# Patient Record
Sex: Male | Born: 1955 | Race: White | Hispanic: No | Marital: Married | Smoking: Never smoker
Health system: Southern US, Community
[De-identification: ages and names within clinical notes are randomized; demographics above are authoritative.]

## PROBLEM LIST (undated history)

## (undated) DIAGNOSIS — Z9989 Dependence on other enabling machines and devices: Principal | ICD-10-CM

## (undated) DIAGNOSIS — E782 Mixed hyperlipidemia: Secondary | ICD-10-CM

## (undated) DIAGNOSIS — K760 Fatty (change of) liver, not elsewhere classified: Secondary | ICD-10-CM

## (undated) DIAGNOSIS — E291 Testicular hypofunction: Secondary | ICD-10-CM

## (undated) DIAGNOSIS — E669 Obesity, unspecified: Secondary | ICD-10-CM

## (undated) DIAGNOSIS — M109 Gout, unspecified: Secondary | ICD-10-CM

## (undated) DIAGNOSIS — G4733 Obstructive sleep apnea (adult) (pediatric): Principal | ICD-10-CM

## (undated) HISTORY — DX: Testicular hypofunction: E29.1

## (undated) HISTORY — DX: Obesity, unspecified: E66.9

## (undated) HISTORY — DX: Obstructive sleep apnea (adult) (pediatric): G47.33

## (undated) HISTORY — DX: Gout, unspecified: M10.9

## (undated) HISTORY — DX: Mixed hyperlipidemia: E78.2

## (undated) HISTORY — DX: Dependence on other enabling machines and devices: Z99.89

## (undated) HISTORY — DX: Fatty (change of) liver, not elsewhere classified: K76.0

---
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
## 2001-05-10 ENCOUNTER — Encounter: Payer: Self-pay | Admitting: Internal Medicine

## 2001-05-10 ENCOUNTER — Ambulatory Visit (HOSPITAL_COMMUNITY): Admission: RE | Admit: 2001-05-10 | Discharge: 2001-05-10 | Payer: Self-pay | Admitting: Internal Medicine

## 2001-07-27 ENCOUNTER — Ambulatory Visit (HOSPITAL_COMMUNITY): Admission: RE | Admit: 2001-07-27 | Discharge: 2001-07-27 | Payer: Self-pay

## 2001-08-06 ENCOUNTER — Encounter: Admission: RE | Admit: 2001-08-06 | Discharge: 2001-09-25 | Payer: Self-pay | Admitting: *Deleted

## 2002-02-03 ENCOUNTER — Ambulatory Visit: Admission: RE | Admit: 2002-02-03 | Discharge: 2002-02-03 | Payer: Self-pay | Admitting: Internal Medicine

## 2005-08-19 ENCOUNTER — Ambulatory Visit (HOSPITAL_COMMUNITY): Admission: RE | Admit: 2005-08-19 | Discharge: 2005-08-19 | Payer: Self-pay | Admitting: Internal Medicine

## 2005-11-12 ENCOUNTER — Ambulatory Visit (HOSPITAL_COMMUNITY): Admission: RE | Admit: 2005-11-12 | Discharge: 2005-11-12 | Payer: Self-pay | Admitting: Orthopaedic Surgery

## 2007-08-17 ENCOUNTER — Ambulatory Visit (HOSPITAL_COMMUNITY): Admission: RE | Admit: 2007-08-17 | Discharge: 2007-08-17 | Payer: Self-pay | Admitting: Internal Medicine

## 2007-09-03 ENCOUNTER — Ambulatory Visit (HOSPITAL_COMMUNITY): Admission: RE | Admit: 2007-09-03 | Discharge: 2007-09-03 | Payer: Self-pay | Admitting: General Surgery

## 2007-09-15 ENCOUNTER — Ambulatory Visit (HOSPITAL_COMMUNITY): Admission: RE | Admit: 2007-09-15 | Discharge: 2007-09-15 | Payer: Self-pay | Admitting: Orthopaedic Surgery

## 2007-09-25 ENCOUNTER — Ambulatory Visit (HOSPITAL_COMMUNITY): Admission: RE | Admit: 2007-09-25 | Discharge: 2007-09-25 | Payer: Self-pay | Admitting: Orthopaedic Surgery

## 2009-06-04 IMAGING — CT CT ABDOMEN W/ CM
1 of 3 series · 14 of 32 positions shown, 19 images · IV contrast (Omnipaque 300)
Comparison: None

ABDOMEN CT WITH CONTRAST

CLINICAL DATA: Hematuria, back pain
TECHNIQUE: Multidetector CT imaging of the abdomen and pelvis was performed
following the standard protocol during bolus administration of intravenous
contrast.

Contrast:  100 cc Omnipaque 300

[Series 2: abd_pel 5.0 b40f · axial · 0.74mm/px · z∈[-469,-39]mm · 14 of 98 slices shown, 19 images]
[im 6/98  soft-tissue]
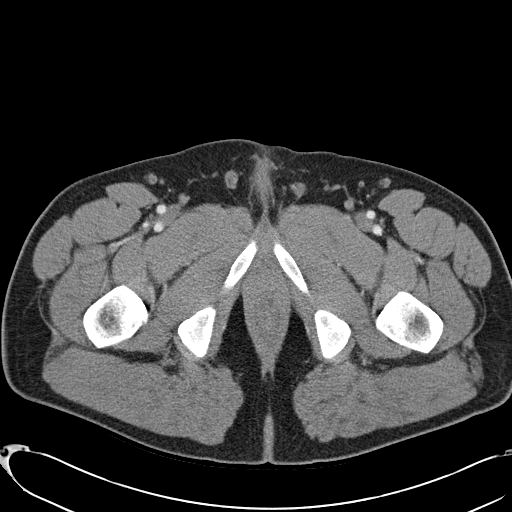
[im 6/98  bone]
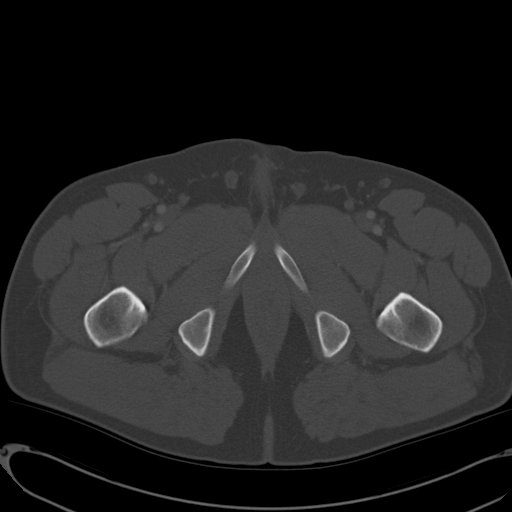
[im 11/98  soft-tissue]
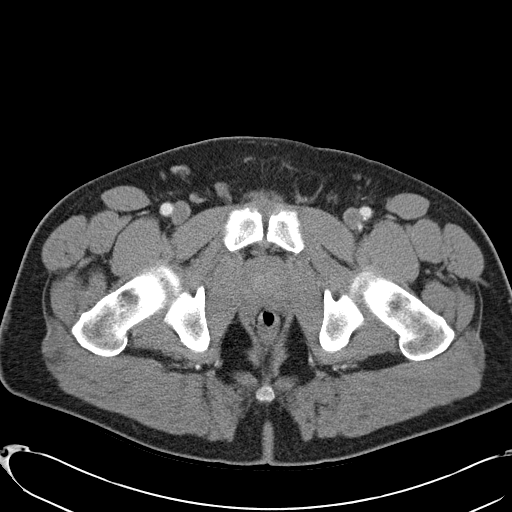
[im 22/98  soft-tissue]
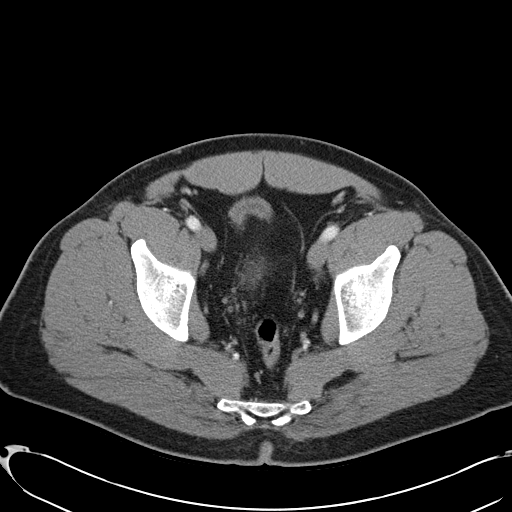
[im 27/98  soft-tissue]
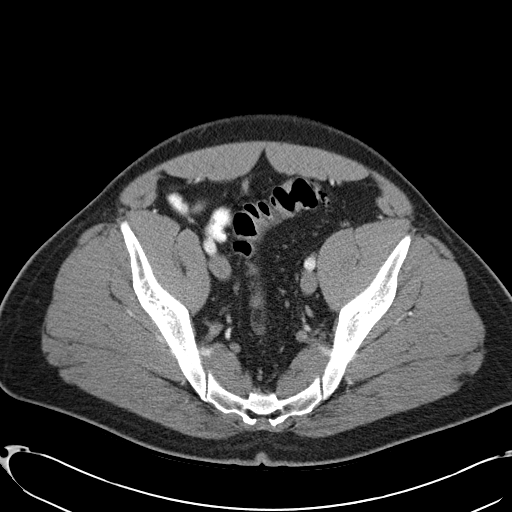
[im 33/98  soft-tissue]
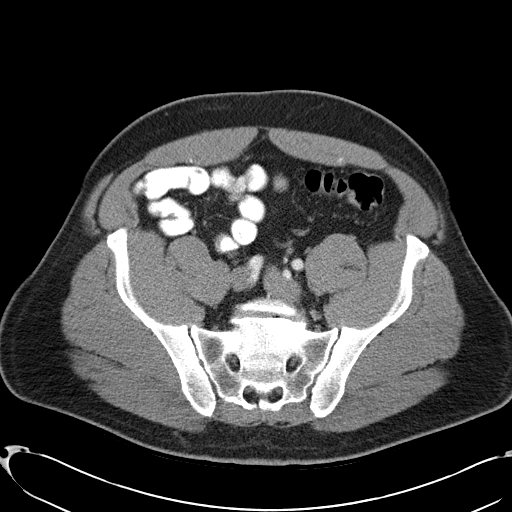
[im 44/98  soft-tissue]
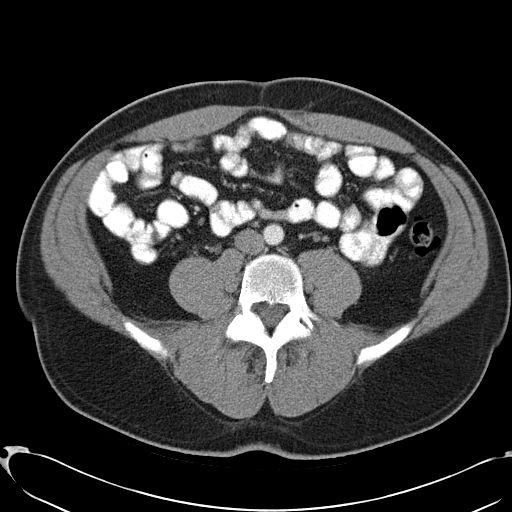
[im 49/98  soft-tissue]
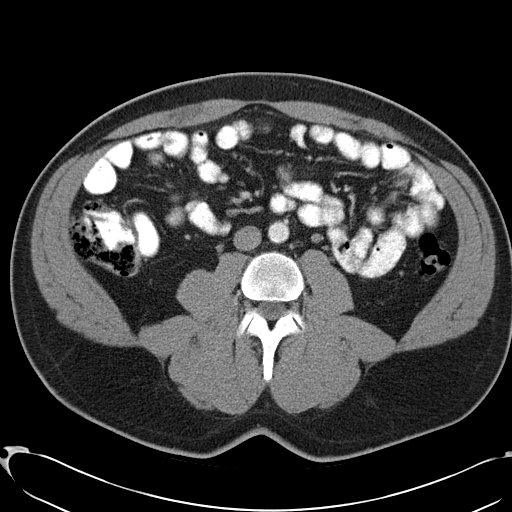
[im 54/98  soft-tissue]
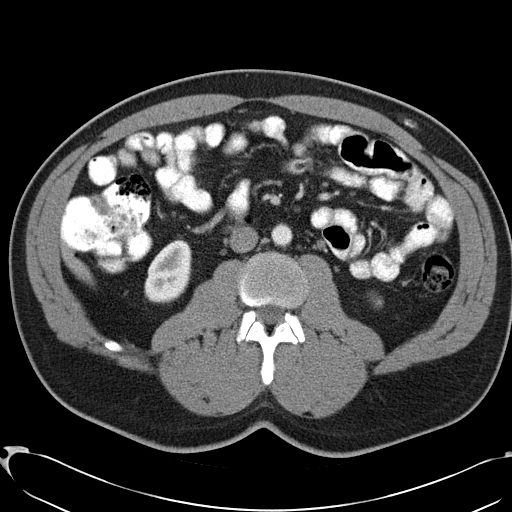
[im 65/98  soft-tissue]
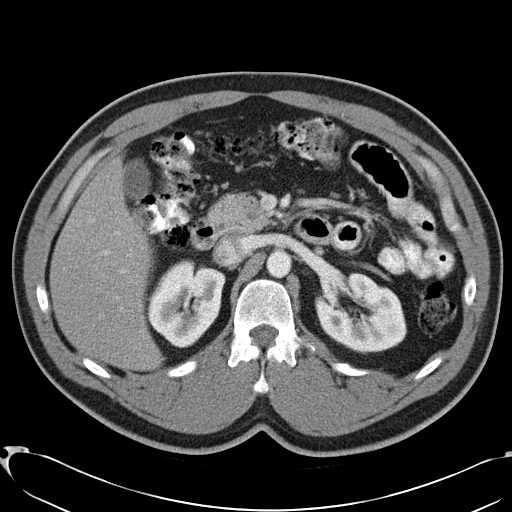
[im 65/98  bone]
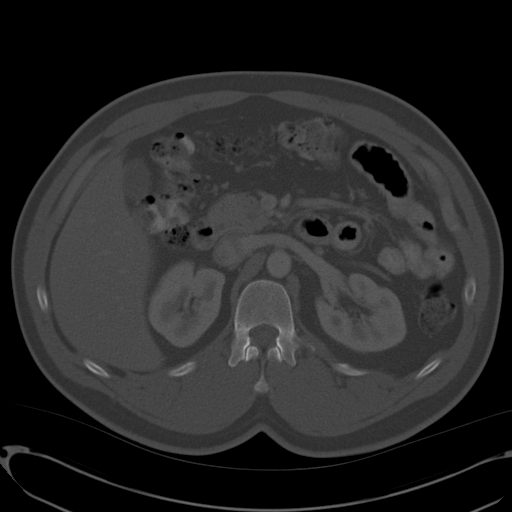
[im 71/98  soft-tissue]
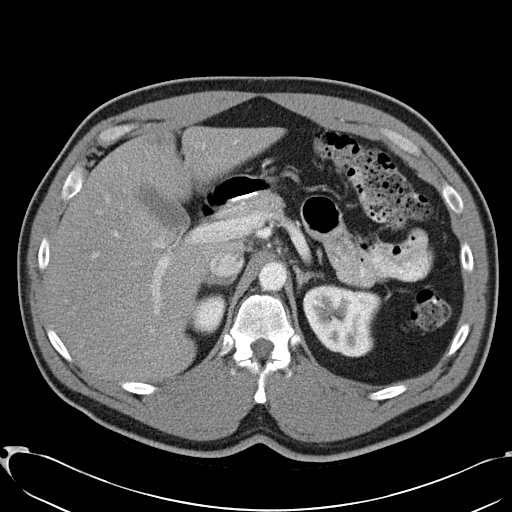
[im 76/98  soft-tissue]
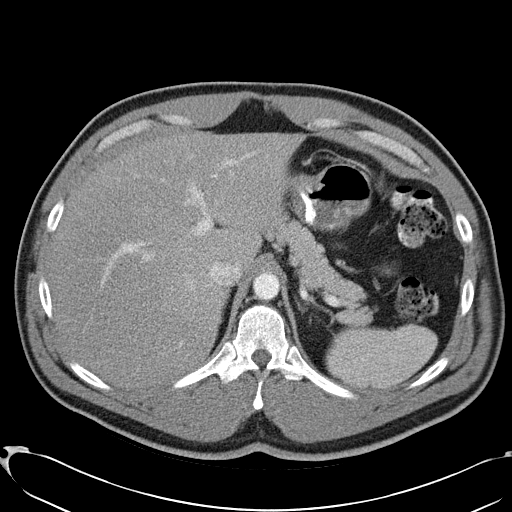
[im 76/98  lung]
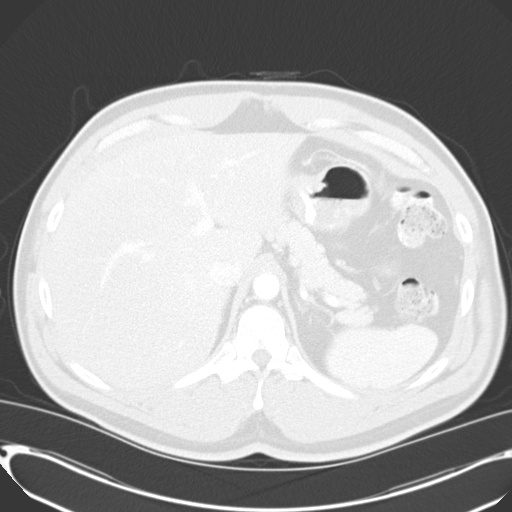
[im 81/98  lung]
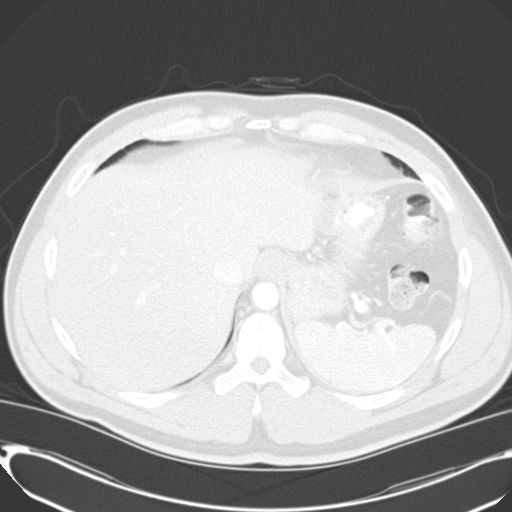
[im 87/98  soft-tissue]
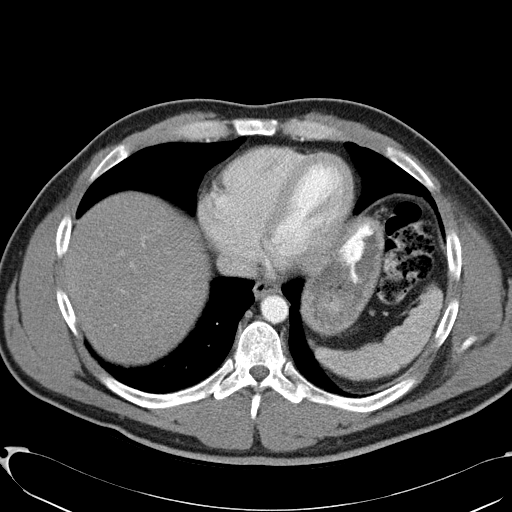
[im 87/98  lung]
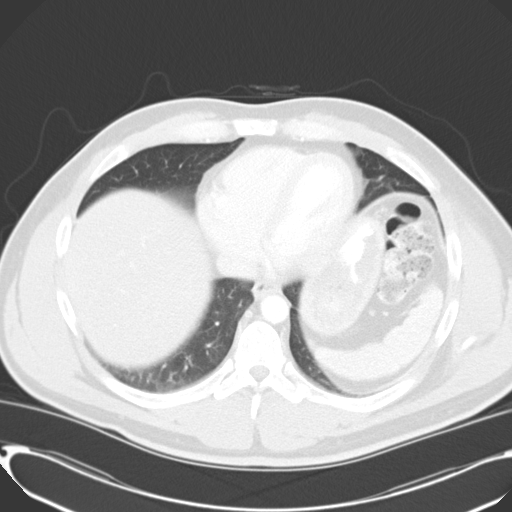
[im 92/98  soft-tissue]
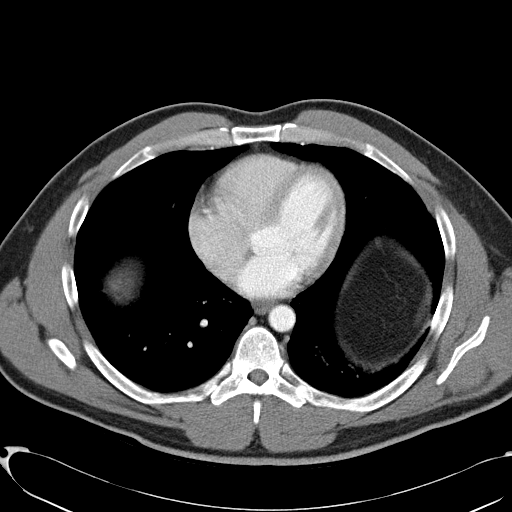
[im 92/98  lung]
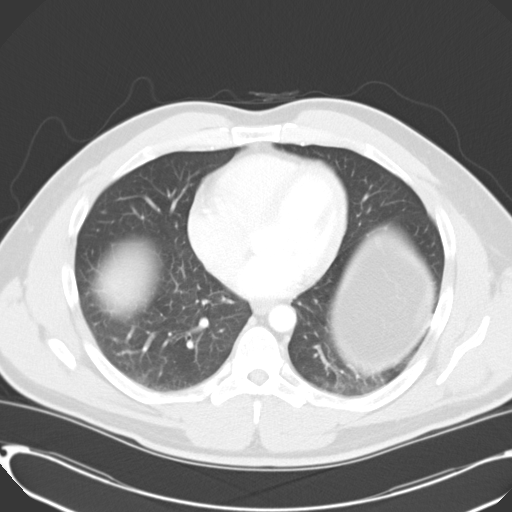

[14 of 32 positions shown; findings below may reference images not displayed]

FINDINGS: There is mild fatty infiltration of the liver. No focal lesion in the
liver, spleen, pancreas, adrenals, or kidneys. Gallbladder unremarkable. No free
fluid, free air, or adenopathy. Scattered mesenteric nodes noted, none of which
are pathologically enlarged.

No hydronephrosis within the kidneys. No stones. Bowel grossly unremarkable.
Lung bases are clear.

IMPRESSION

Mild fatty infiltration of the liver.

No acute findings in the abdomen.

PELVIS CT WITH CONTRAST
FINDINGS: Ureters are decompressed. No stones. Urinary bladder is unremarkable.
No free fluid, free air, or adenopathy. Pelvic large and small bowel
unremarkable except for scattered sigmoid diverticula.

Bony structures unremarkable.

IMPRESSION

No acute findings in the pelvis.

Mild sigmoid diverticulosis.

## 2009-06-12 ENCOUNTER — Ambulatory Visit (HOSPITAL_COMMUNITY): Admission: RE | Admit: 2009-06-12 | Discharge: 2009-06-12 | Payer: Self-pay | Admitting: Family Medicine

## 2010-10-14 ENCOUNTER — Encounter: Payer: Self-pay | Admitting: Orthopaedic Surgery

## 2010-10-14 ENCOUNTER — Encounter: Payer: Self-pay | Admitting: Urology

## 2014-06-13 ENCOUNTER — Institutional Professional Consult (permissible substitution): Payer: BC Managed Care – PPO | Admitting: Neurology

## 2014-06-13 ENCOUNTER — Encounter: Payer: Self-pay | Admitting: Neurology

## 2014-06-13 ENCOUNTER — Ambulatory Visit (INDEPENDENT_AMBULATORY_CARE_PROVIDER_SITE_OTHER): Payer: BC Managed Care – PPO | Admitting: Neurology

## 2014-06-13 VITALS — BP 117/79 | HR 75 | Resp 18 | Ht 68.0 in | Wt 211.0 lb

## 2014-06-13 DIAGNOSIS — Z9989 Dependence on other enabling machines and devices: Principal | ICD-10-CM

## 2014-06-13 DIAGNOSIS — G4733 Obstructive sleep apnea (adult) (pediatric): Secondary | ICD-10-CM

## 2014-06-13 HISTORY — DX: Obstructive sleep apnea (adult) (pediatric): G47.33

## 2014-06-13 NOTE — Patient Instructions (Signed)
Polysomnography (Sleep Studies) Polysomnography (PSG) is a series of tests used for detecting (diagnosing) obstructive sleep apnea and other sleep disorders. The tests measure how some parts of your body are working while you are sleeping. The tests are extensive and expensive. They are done in a sleep lab or hospital, and vary from center to center. Your caregiver may perform other more simple sleep studies and questionnaires before doing more complete and involved testing. Testing may not be covered by insurance. Some of these tests are:  An EEG (Electroencephalogram). This tests your brain waves and stages of sleep.  An EOG (Electrooculogram). This measures the movements of your eyes. It detects periods of REM (rapid eye movement) sleep, which is your dream sleep.  An EKG (Electrocardiogram). This measures your heart rhythm.  EMG (Electromyography). This is a measurement of how the muscles are working in your upper airway and your legs while sleeping.  An oximetry measurement. It measures how much oxygen (air) you are getting while sleeping.  Breathing efforts may be measured. The same test can be interpreted (understood) differently by different caregivers and centers that study sleep.  Studies may be given an apnea/hypopnea index (AHI). This is a number which is found by counting the times of no breathing or under breathing during the night, and relating those numbers to the amount of time spent in bed. When the AHI is greater than 15, the patient is likely to complain of daytime sleepiness. When the AHI is greater than 30, the patient is at increased risk for heart problems and must be followed more closely. Following the AHI also allows you to know how treatment is working. Simple oximetry (tracking the amount of oxygen that is taken in) can be used for screening patients who:  Do not have symptoms (problems) of OSA.  Have a normal Epworth Sleepiness Scale Score.  Have a low pre-test  probability of having OSA.  Have none of the upper airway problems likely to cause apnea.  Oximetry is also used to determine if treatment is effective in patients who showed significant desaturations (not getting enough oxygen) on their home sleep study. One extra measure of safety is to perform additional studies for the person who only snores. This is because no one can predict with absolute certainty who will have OSA. Those who show significant desaturations (not getting enough oxygen) are recommended to have a more detailed sleep study. Document Released: 03/16/2003 Document Revised: 12/02/2011 Document Reviewed: 11/15/2013 ExitCare Patient Information 2015 [employer]. This information is not intended to replace advice given to you by your health care provider. Make sure you discuss any questions you have with your health care provider.  

## 2014-06-13 NOTE — Progress Notes (Signed)
SLEEP MEDICINE CLINIC   Provider:  Melvyn Novas, M D  Referring Provider: Elfredia Nevins, MD Primary Care Physician:  Cassell Smiles., MD  Chief Complaint  Patient presents with  . New Evaluation    Room 10  . Sleep consult    HPI:  Eric Johns is a 58 y.o. male, who is seen here as a revisit from Dr. Sherwood Gambler for a sleep consult.   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 He lived in [place], in [place], This frequent traveling and living at primitive circumstances has made the use of his current CPAP  difficult. He was diagnosed several years ago with obstructive sleep apnea and a CPAP machine was prescribed at 8 cm water pressure. The study was performed at [facility] in [place], [place].  His current machine he has used for 6 years and this is the second machine he has over all and this diagnosis it is likely more than a decade ago. Because of the restrictions with carry-on luggage and living circumstances and some second [place] countries he has not limited to CPAP and was in contact with a dentist. He received a dental device is some ulnad these retainer. This may have triggered his snoring, but he by varying the device he would be sweat drenched- he noted that when waking up. It is very likely that he had tachycardia and bradycardia arrhythmias due to  oxygen deficiency when using a "snoring" device only.  He [place], and would like to be evaluated first.  Was able today to access the download data package from the patient CPAP machine as a lead to respiratory she. The data collection begun 03-15-14 at and at 06-12-14 the patient's machine is set at 8 cm water the median air leak is 14.4 L per minute with residual AHI was 8.2. I was a little bit high.  His average times daily spent with CPAP user 6 hours and 6 minutes and - fully complies with the requirements of his  insurance carrier. 86% compliance over 4 hours. No a FFM user.  He never heard of nasal pillows.   He estimated his nocturnal sleep time 6-7 hours, bedtime 11 , rise time 6.30 on average , one bathroom break. Sleeps alone. Dry mouth . Eye drop and nasal saline spray and a drink of water in AM>       Review of Systems: Out of a complete 14 system review, the patient complains of only the following symptoms, and all other reviewed systems are negative.   Epworth score  7 , Fatigue severity score 22  , depression score 6   History   Social History  . Marital Status: Married    Spouse Name: [name]    Number of Children: 3  . Years of Education: College   Occupational History  . Not on file.   Social History Main Topics  . Smoking status: Never Smoker   . Smokeless tobacco: Former Neurosurgeon    Types: Snuff    Quit date: 08/24/2007  . Alcohol Use: 0.6 - 1.2 oz/week    1-2 Glasses of wine per week  . Drug Use: No  . Sexual Activity: Not on file   Other Topics Concern  . Not on file   Social History Narrative   Patient is married [name]) and lives at home with his wife and step-son.   Patient is retired and working part-time -  Consultant work.   Patient is currently in college for his Master's degree.   Patient is right-handed.   Patient has three children and his wife has two children.   Patient drinks one cup of coffee daily.    Family History  Problem Relation Age of Onset  . Diabetes Maternal Grandfather   . Emphysema Maternal Grandfather   . Diverticulitis Father     Past Medical History  Diagnosis Date  . Sleep apnea   . Obesity   . Snores     Past Surgical History  Procedure Laterality Date  . Appendectomy  1972    Current Outpatient Prescriptions  Medication Sig Dispense Refill  . colchicine 0.6 MG tablet Take 0.6 mg by mouth. Four times a day as needed      . Multiple Vitamins-Minerals (MULTIVITAMIN PO) Take by mouth. Taking daily ( [employer] pack)      .  naproxen (NAPROSYN) 500 MG tablet Take 500 mg by mouth 2 (two) times daily with a meal.      . sildenafil (VIAGRA) 100 MG tablet Take 100 mg by mouth daily as needed for erectile dysfunction.       No current facility-administered medications for this visit.    Allergies as of 06/13/2014  . (Not on File)    Vitals: BP 117/79  Pulse 75  Resp 18  Ht  (1.727 m)  Wt 211 lb (95.709 kg)  BMI 32.09 kg/m2 Last Weight:  Wt Readings from Last 1 Encounters:  06/13/14 211 lb (95.709 kg)       Last Height:   Ht Readings from Last 1 Encounters:  06/13/14  (1.727 m)    Physical exam:  General: The patient is awake, alert and appears not in acute distress. The patient is well groomed. Head: Normocephalic, atraumatic. Neck is supple. Mallampati 3 -4,  neck circumference: 18.5 . Nasal airflow not restricted , TMJ is not  evident . Retrognathia is not seen.  Cardiovascular:  Regular rate and rhythm, without  murmurs or carotid bruit, and without distended neck veins. Respiratory: Lungs are clear to auscultation. Skin:  Without evidence of edema, or rash Trunk: BMI is elevated and patient  has normal posture.  Neurologic exam : The patient is awake and alert, oriented to place and time.   Memory subjective described as intact. There is a normal attention span & concentration ability.  Speech is fluent without dysarthria, dysphonia or aphasia. Mood and affect are appropriate.  Cranial nerves: Pupils are equal and briskly reactive to light. Funduscopic exam without evidence of pallor or edema.  Extraocular movements  in vertical and horizontal planes intact and without nystagmus. Visual fields by finger perimetry are intact. Hearing to finger rub intact.  Facial sensation intact to fine touch. Facial motor strength is symmetric and tongue and uvula move midline.  Motor exam:  Normal tone, muscle bulk and symmetric ,strength in all extremities.  Sensory:  Fine touch, pinprick and  vibration were tested in all extremities. Proprioception is normal.  Coordination: Rapid alternating movements in the fingers/hands is normal.  Finger-to-nose maneuver normal without evidence of ataxia, dysmetria or tremor.  Gait and station: Patient walks without assistive device and is able unassisted to climb up to the exam table.  Strength within normal limits. Stance is stable and normal. Tandem gait is unfragmented. Romberg testing is negative.  Deep tendon reflexes: in the upper and lower extremities are symmetric and intact. Babinski maneuver response is  downgoing.   Assessment:  After physical and neurologic examination, review of laboratory studies, imaging, neurophysiology testing and pre-existing records, assessment is  Patient on CPAP 8 cm, baseline study not available, likely 12 years ago , [facility].    Residual AHI - too high. Retitration necessary. Patient has facial hair, would likely do better on nasal pillow.  Co2 and SPLIT at 15 AHI, score at 3%.  The patient was advised of the nature of the diagnosed sleep disorder , the treatment options and risks for general a health and wellness arising from not treating the condition. Visit duration was 45 minutes.   Plan:  Treatment plan and additional workup :  Split after earliest point. AHI 15, score at 3 % ,  Nasal pillow.  patient should bring his dental device to the lab.          Porfirio Mylar Caldonia Leap MD  06/13/2014

## 2014-07-20 ENCOUNTER — Ambulatory Visit (INDEPENDENT_AMBULATORY_CARE_PROVIDER_SITE_OTHER): Payer: BC Managed Care – PPO | Admitting: Neurology

## 2014-07-20 ENCOUNTER — Encounter: Payer: Self-pay | Admitting: Neurology

## 2014-07-20 VITALS — BP 138/68

## 2014-07-20 DIAGNOSIS — Z9989 Dependence on other enabling machines and devices: Principal | ICD-10-CM

## 2014-07-20 DIAGNOSIS — G4733 Obstructive sleep apnea (adult) (pediatric): Secondary | ICD-10-CM

## 2014-07-20 NOTE — Sleep Study (Signed)
Please see the scanned sleep study interpretation located in the Procedure tab within the Chart Review section. 

## 2014-08-01 ENCOUNTER — Other Ambulatory Visit: Payer: Self-pay | Admitting: Neurology

## 2014-08-01 ENCOUNTER — Encounter: Payer: Self-pay | Admitting: *Deleted

## 2014-08-01 ENCOUNTER — Other Ambulatory Visit (HOSPITAL_COMMUNITY): Payer: Self-pay | Admitting: Nephrology

## 2014-08-01 ENCOUNTER — Telehealth: Payer: Self-pay | Admitting: *Deleted

## 2014-08-01 DIAGNOSIS — N183 Chronic kidney disease, stage 3 unspecified: Secondary | ICD-10-CM

## 2014-08-01 DIAGNOSIS — G4733 Obstructive sleep apnea (adult) (pediatric): Secondary | ICD-10-CM

## 2014-08-01 DIAGNOSIS — R0902 Hypoxemia: Secondary | ICD-10-CM

## 2014-08-01 NOTE — Telephone Encounter (Signed)
Patient was contacted and provided the results of his split night sleep study.  Patient was referred to [facility] for CPAP set up.  Patient was mailed a copy of his test results and Dr. Elfredia NevinsLawrence Fusco was faxed a copy.   Patient instructed to contact our office 6-8 weeks post set up to schedule a follow up appointment.

## 2014-08-10 ENCOUNTER — Ambulatory Visit (HOSPITAL_COMMUNITY): Admission: RE | Admit: 2014-08-10 | Payer: BC Managed Care – PPO | Source: Ambulatory Visit

## 2014-10-08 ENCOUNTER — Encounter: Payer: Self-pay | Admitting: Neurology

## 2015-05-09 DIAGNOSIS — R898 Other abnormal findings in specimens from other organs, systems and tissues: Secondary | ICD-10-CM | POA: Insufficient documentation

## 2015-05-09 DIAGNOSIS — D721 Eosinophilia: Secondary | ICD-10-CM

## 2015-08-08 ENCOUNTER — Other Ambulatory Visit: Payer: Self-pay | Admitting: Oncology

## 2015-08-08 DIAGNOSIS — R7989 Other specified abnormal findings of blood chemistry: Secondary | ICD-10-CM | POA: Insufficient documentation

## 2015-08-08 DIAGNOSIS — R945 Abnormal results of liver function studies: Secondary | ICD-10-CM | POA: Insufficient documentation

## 2015-08-08 DIAGNOSIS — D721 Eosinophilia, unspecified: Secondary | ICD-10-CM

## 2015-08-11 ENCOUNTER — Ambulatory Visit
Admission: RE | Admit: 2015-08-11 | Discharge: 2015-08-11 | Disposition: A | Discharge status: Home / Self Care | Payer: BC Managed Care – PPO | Source: Ambulatory Visit | Attending: Oncology | Admitting: Oncology

## 2015-08-11 DIAGNOSIS — D721 Eosinophilia, unspecified: Secondary | ICD-10-CM

## 2015-08-11 DIAGNOSIS — R7989 Other specified abnormal findings of blood chemistry: Secondary | ICD-10-CM | POA: Diagnosis not present

## 2015-08-11 DIAGNOSIS — R945 Abnormal results of liver function studies: Secondary | ICD-10-CM

## 2015-08-11 MED ORDER — IOHEXOL 300 MG/ML  SOLN
100.0000 mL | Freq: Once | INTRAMUSCULAR | Status: AC | PRN
  Administered 2015-08-11: 100 mL via INTRAVENOUS

## 2016-03-04 ENCOUNTER — Encounter: Payer: Self-pay | Admitting: Neurology

## 2016-03-04 ENCOUNTER — Ambulatory Visit (INDEPENDENT_AMBULATORY_CARE_PROVIDER_SITE_OTHER): Payer: BC Managed Care – PPO | Admitting: Neurology

## 2016-03-04 VITALS — BP 102/72 | HR 68 | Resp 20 | Ht 68.0 in | Wt 216.0 lb

## 2016-03-04 DIAGNOSIS — Z9989 Dependence on other enabling machines and devices: Principal | ICD-10-CM

## 2016-03-04 DIAGNOSIS — G4733 Obstructive sleep apnea (adult) (pediatric): Secondary | ICD-10-CM

## 2016-03-04 NOTE — Progress Notes (Signed)
SLEEP MEDICINE CLINIC   Provider:  Melvyn Novasarmen  Ezinne Yogi, M D  Referring Provider: Elfredia NevinsFusco, Lawrence, MD Primary Care Physician:  Cassell SmilesFUSCO,LAWRENCE J., MD  Chief Complaint  Patient presents with  . New Patient (Initial Visit)    cpap going well, using [facility], rm 11, alone    HPI:  Eric Johns is a 60 y.o. male, who is seen here as a revisit from Dr. Sherwood GamblerFusco for a sleep consult.   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 He lived in [place], in [place], This frequent traveling and living at primitive circumstances has made the use of his current CPAP  difficult. He was diagnosed several years ago with obstructive sleep apnea and a CPAP machine was prescribed at 8 cm water pressure. The study was performed at [facility] in [place], [place].  His current machine he has used for 6 years and this is the second machine he has over all and this diagnosis it is likely more than a decade ago. Because of the restrictions with carry-on luggage and living circumstances and some second [place] countries he has not limited to CPAP and was in contact with a dentist. He received a dental device is some ulnad these retainer. This may have triggered his snoring, but he by varying the device he would be sweat drenched- he noted that when waking up. It is very likely that he had tachycardia and bradycardia arrhythmias due to  oxygen deficiency when using a "snoring" device only.  He is [place], and would like to be evaluated first.  Was able today to access the download data package from the patient CPAP machine . The data collection begun 03-15-14 at and ended on  06-12-14 the patient's machine is set at 8 cm water the median air leak is 14.4 L per minute with residual AHI was 8.2. I was a little bit high.  His average times daily spent with CPAP user 6 hours and 6 minutes and - fully complies with the requirements of  his insurance carrier. 86% compliance over 4 hours. No a FFM user.  He never heard of nasal pillows.   He estimated his nocturnal sleep time 6-7 hours, bedtime 11 , rise time 6.30 on average , one bathroom break. Sleeps alone. Dry mouth . Eye drop and nasal saline spray and a drink of water in AM>   03-13-2016,  I have the pleasure of seeing Mr. 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 Mr. Eric Johns's CPAP data are impressive he's 100% compliant for the last 30 days the average user time is 6 hours and 57 minutes. He is using an AutoSet between 4 and 8 cm water pressure with 3 cm EPR. AHI is 6.4 which is a little higher than I like. He does not have significant air leaks. The 95th percentile pressure used is now 7.9 cm. This leads to see assumption that we need to raise his pressure window. [facility] will [facility] his maximum pressure to 10 cm water for that to his room.  His last pressure was determined after a split-night polysomnography dated 28th of October 2015 that the patient's diagnostic AHI was 25.8 in supine sleep 31.4 and there was no REM sleep seen in the diagnostic portion oxygen  nadir during the study was 84% saturation with a total desaturation time of 55.6 minutes. Titration study reduce the oxygen desaturations significantly and the overall AHI was 3.2 at 8 cm water.    Review of Systems: Out of a complete 14 system review, the patient complains of only the following symptoms, and all other reviewed systems are negative.  Epworth score 4 from   7 , Fatigue severity score still 22  , depression score 6.   Social History   Social History  . Marital Status: Married    Spouse Name: Eric Johns  . Number of Children: 3  . Years of Education: College   Occupational History  . Not on file.   Social  History Main Topics  . Smoking status: Never Smoker   . Smokeless tobacco: Former Neurosurgeon    Types: Snuff    Quit date: 08/24/2007  . Alcohol Use: 0.6 - 1.2 oz/week    1-2 Glasses of wine per week  . Drug Use: No  . Sexual Activity: Not on file   Other Topics Concern  . Not on file   Social History Narrative   Patient is married Eric Johns) and lives at home with his wife and step-son.   Patient is retired and working part-time - Research scientist (medical) work.   Patient is currently in college for his Master's degree.   Patient is right-handed.   Patient has three children and his wife has two children.   Patient drinks one cup of coffee daily.    Family History  Problem Relation Age of Onset  . Diabetes Maternal Grandfather   . Emphysema Maternal Grandfather   . Diverticulitis Father     Past Medical History  Diagnosis Date  . Sleep apnea   . Obesity   . Snores   . OSA on CPAP 06/13/2014  . Mixed hyperlipidemia     Past Surgical History  Procedure Laterality Date  . Appendectomy  1972    Current Outpatient Prescriptions  Medication Sig Dispense Refill  . allopurinol (ZYLOPRIM) 100 MG tablet Take by mouth.    Marland Kitchen aspirin 81 MG tablet Take 81 mg by mouth daily.    . fluticasone (FLONASE) 50 MCG/ACT nasal spray Place into both nostrils daily.    . Multiple Vitamins-Minerals (MULTIVITAMIN PO) Take by mouth. Taking daily ( [employer] pack)     No current facility-administered medications for this visit.    Allergies as of 03/04/2016  . (No Known Allergies)    Vitals: BP 102/72 mmHg  Pulse 68  Resp 20  Ht  (1.727 m)  Wt 216 lb (97.977 kg)  BMI 32.85 kg/m2 Last Weight:  Wt Readings from Last 1 Encounters:  03/04/16 216 lb (97.977 kg)       Last Height:   Ht Readings from Last 1 Encounters:  03/04/16  (1.727 m)    Physical exam:  General: The patient is awake, alert and appears not in acute distress. The patient is well groomed. Head: Normocephalic, atraumatic. Neck is  supple. Mallampati 3 -4,  neck circumference: 18.5 . Nasal airflow not restricted , TMJ is not  evident . Retrognathia is not seen.  Cardiovascular:  Regular rate and rhythm, without  murmurs or carotid bruit, and without distended neck veins. Respiratory: Lungs are clear to auscultation. Skin:  Without evidence of edema, or rash Trunk: BMI is elevated and patient  has normal posture.  Neurologic exam : The patient is awake and alert, oriented to place and time.  Memory subjective described as intact. There is a normal attention span & concentration ability.  Speech is fluent without dysarthria, dysphonia or aphasia. Mood and affect are appropriate.  Cranial nerves: Pupils are equal and briskly reactive to light. Funduscopic exam without evidence of pallor or edema.  Extraocular movements  in vertical and horizontal planes intact and without nystagmus. Visual fields by finger perimetry are intact. Hearing to finger rub intact.  Facial sensation intact to fine touch. Facial motor strength is symmetric and tongue and uvula move midline.    Assessment:  After physical and neurologic examination, review of laboratory studies, imaging, neurophysiology testing and pre-existing records, assessment is  Patient was on CPAP 8 cm, baseline study wasnot available, likely 14 years ago , [employer] [facility].  3 titrated to an October 2015 and has used an auto titrated. Since his AHI is slightly higher than I like and not associated with a high air leak I will open the upper pressure window for his auto titration from between 4 and 8 to 4 and 10 cm .   The patient was advised of the nature of the diagnosed sleep disorder, the treatment options and risks for general a health and wellness arising from not treating the condition. Visit duration was 25 minutes.   Plan:  Treatment plan and additional workup : [facility] is the [facility]'s [facility] for supplies. He will need a new mask headgear filter tubing etc.  I would like for it once [facility] to reset the auto titration from 4-10 cm water pressure. RV in 12 month .            Porfirio Mylar Kaliana Albino MD  03/04/2016

## 2016-08-28 ENCOUNTER — Ambulatory Visit (INDEPENDENT_AMBULATORY_CARE_PROVIDER_SITE_OTHER): Payer: BC Managed Care – PPO

## 2016-08-28 ENCOUNTER — Ambulatory Visit (INDEPENDENT_AMBULATORY_CARE_PROVIDER_SITE_OTHER): Payer: BC Managed Care – PPO | Admitting: Physician Assistant

## 2016-08-28 VITALS — BP 122/72 | HR 86 | Temp 98.4°F | Resp 17 | Ht 67.5 in | Wt 224.0 lb

## 2016-08-28 DIAGNOSIS — R05 Cough: Secondary | ICD-10-CM | POA: Diagnosis not present

## 2016-08-28 DIAGNOSIS — R059 Cough, unspecified: Secondary | ICD-10-CM

## 2016-08-28 MED ORDER — BENZONATATE 100 MG PO CAPS: 100.0000 mg | ORAL_CAPSULE | Freq: Three times a day (TID) | ORAL | 0 refills | Status: DC | PRN

## 2016-08-28 MED ORDER — AZITHROMYCIN 250 MG PO TABS: ORAL_TABLET | ORAL | 0 refills | Status: DC

## 2016-08-28 MED ORDER — ALBUTEROL SULFATE 108 (90 BASE) MCG/ACT IN AEPB: 2.0000 | INHALATION_SPRAY | Freq: Four times a day (QID) | RESPIRATORY_TRACT | 0 refills | Status: DC | PRN

## 2016-08-28 MED ORDER — HYDROCOD POLST-CPM POLST ER 10-8 MG/5ML PO SUER: 5.0000 mL | Freq: Two times a day (BID) | ORAL | 0 refills | Status: DC | PRN

## 2016-08-28 NOTE — Progress Notes (Signed)
MRN: [id number] DOB: 1955-11-02  Subjective:   Eric Johns is a 60 y.o. male presenting for chief complaint of URI .  Reports 5 day history of sinus headache, sinus pain, ear fullness, sore throat, dry cough (no hemoptysis), wheezing, shortness of breath and chest tightness, chills. Has tried robitussin dm, mucinex dm, generic theraflu, and cough drops with no relief and he actually feels worse. Denies fever,  night sweats, nausea, vomiting and abdominal pain. Has not had sick contact with anyone. No history of seasonal allergies or asthma. No smoking. 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  Denies any other aggravating or relieving factors, no other questions or concerns.  Eric Johns has a current medication list which includes the following prescription(s): allopurinol, aspirin, colchicine, fluticasone, and multiple vitamins-minerals. Also has No Known Allergies.  Eric Johns  has a past medical history of Mixed hyperlipidemia; Obesity; OSA on CPAP (06/13/2014); Sleep apnea; and Snores. Also  has a past surgical history that includes Appendectomy (1972).   Objective:   Vitals: BP 122/72 (BP Location: Right Arm, Patient Position: Sitting, Cuff Size: Normal)   Pulse 86   Temp 98.4 F (36.9 C) (Oral)   Resp 17   Ht 5' 7.5" (1.715 m)   Wt 224 lb (101.6 kg)   SpO2 95%   BMI 34.57 kg/m   Physical Exam  Constitutional: He is oriented to person, place, and time. He appears well-developed and well-nourished.  HENT:  Head: Normocephalic and atraumatic.  Right Ear: Tympanic membrane, external ear and ear canal normal.  Left Ear: Tympanic membrane, external ear and ear canal normal.  Nose: Mucosal edema and rhinorrhea present. Right sinus exhibits no maxillary sinus tenderness and no frontal sinus tenderness. Left sinus exhibits no maxillary sinus tenderness and no frontal sinus tenderness.  Mouth/Throat: Uvula is midline and mucous membranes are normal. Posterior oropharyngeal erythema present.    Eyes: Conjunctivae are normal.  Neck: Normal range of motion.  Cardiovascular: Normal rate, regular rhythm and normal heart sounds.   Pulmonary/Chest: Effort normal. He has rales (small area noted on posterior left lung field ).  Lymphadenopathy:       Head (right side): No submental, no submandibular, no tonsillar, no preauricular, no posterior auricular and no occipital adenopathy present.       Head (left side): No submental, no submandibular, no tonsillar, no preauricular, no posterior auricular and no occipital adenopathy present.    He has no cervical adenopathy.       Right: No supraclavicular adenopathy present.       Left: No supraclavicular adenopathy present.  Neurological: He is alert and oriented to person, place, and time.  Skin: Skin is warm and dry.  Psychiatric: He has a normal mood and affect.  Vitals reviewed.   No results found for this or any previous visit (from the past 24 hour(s)).  Dg Chest 2 View  Result Date: 08/28/2016 CLINICAL DATA:  Five days of cough and shortness of breath. Abnormal chest exam posteriorly on the left. History of obesity, sleep apnea, patient is on CPAP. Nonsmoker. EXAM: CHEST  2 VIEW COMPARISON:  None. FINDINGS: The lungs are adequately inflated. There is mild elevation of the left hemidiaphragm with gas-filled splenic flexure noted beneath it. There is no interstitial or alveolar infiltrate. There is no pleural effusion or pneumothorax. The heart and pulmonary vascularity are normal. There is calcification in the wall of the aortic arch. The mediastinum is normal in width. The bony structures exhibit no acute abnormalities. IMPRESSION:  There is no active cardiopulmonary disease. Thoracic aortic atherosclerosis. Electronically Signed   By: David  Jordan M.D.   On: 08/28/2016 10:44    Assessment and [place] :  1. Cough - Due to physical exam findings worsening of patient's symptoms, will cover for atypical organisms.  - DG Chest 2 View;  Future - azithromycin (ZITHROMAX) 250 MG tablet; Take 2 tabs PO x 1 dose, then 1 tab PO QD x 4 days  Dispense: 6 tablet; Refill: 0 - benzonatate (TESSALON) 100 MG capsule; Take 1-2 capsules (100-200 mg total) by mouth 3 (three) times daily as needed for cough.  Dispense: 40 capsule; Refill: 0 - chlorpheniramine-HYDROcodone (TUSSIONEX PENNKINETIC ER) 10-8 MG/5ML SUER; Take 5 mLs by mouth every 12 (twelve) hours as needed for cough.  Dispense: 100 mL; Refill: 0 - Albuterol Sulfate (PROAIR RESPICLICK) 108 (90 Base) MCG/ACT AEPB; Inhale 2 puffs into the lungs every 6 (six) hours as needed.  Dispense: 1 each; Refill: 0 -Return to clinic if symptoms worsen, do not improve in one week, or as needed   Eric CoreBrittany Amee Boothe, PA-C  [facility] 08/28/2016 10:56 AM

## 2016-08-28 NOTE — Patient Instructions (Addendum)
Follow up if no improvement in one week, return sooner if symptoms worsen.   Acute Bronchitis, Adult Acute bronchitis is when air tubes (bronchi) in the lungs suddenly get swollen. The condition can make it hard to breathe. It can also cause these symptoms:  A cough.  Coughing up clear, yellow, or green mucus.  Wheezing.  Chest congestion.  Shortness of breath.  A fever.  Body aches.  Chills.  A sore throat. Follow these instructions at home: Medicines  Take over-the-counter and prescription medicines only as told by your doctor.  If you were prescribed an antibiotic medicine, take it as told by your doctor. Do not stop taking the antibiotic even if you start to feel better. General instructions  Rest.  Drink enough fluids to keep your pee (urine) clear or pale yellow.  Avoid smoking and secondhand smoke. If you smoke and you need help quitting, ask your doctor. Quitting will help your lungs heal faster.  Use an inhaler, cool mist vaporizer, or humidifier as told by your doctor.  Keep all follow-up visits as told by your doctor. This is important. How is this prevented? To lower your risk of getting this condition again:  Wash your hands often with soap and water. If you cannot use soap and water, use hand sanitizer.  Avoid contact with people who have cold symptoms.  Try not to touch your hands to your mouth, nose, or eyes.  Make sure to get the flu shot every year. Contact a doctor if:  Your symptoms do not get better in 2 weeks. Get help right away if:  You cough up blood.  You have chest pain.  You have very bad shortness of breath.  You become dehydrated.  You faint (pass out) or keep feeling like you are going to pass out.  You keep throwing up (vomiting).  You have a very bad headache.  Your fever or chills gets worse. This information is not intended to replace advice given to you by your health care provider. Make sure you discuss any  questions you have with your health care provider. Document Released: 02/26/2008 Document Revised: 04/17/2016 Document Reviewed: 02/28/2016 Elsevier Interactive Patient Education  2017 [employer]    IF you received an x-ray today, you will receive an invoice from [facility]. Please contact [facility] at [phone] with questions or concerns regarding your invoice.   IF you received labwork today, you will receive an invoice from [facility]. Please contact [facility] at [phone] with questions or concerns regarding your invoice.   Our billing staff will not be able to assist you with questions regarding bills from these companies.  You will be contacted with the lab results as soon as they are available. The fastest way to get your results is to activate your My Chart account. Instructions are located on the last page of this paperwork. If you have not heard from us regarding the results in 2 weeks, please contact this office.

## 2016-11-09 ENCOUNTER — Ambulatory Visit (INDEPENDENT_AMBULATORY_CARE_PROVIDER_SITE_OTHER): Payer: BC Managed Care – PPO | Admitting: Family Medicine

## 2016-11-09 VITALS — BP 134/76 | HR 70 | Temp 97.5°F | Resp 16 | Ht 67.5 in | Wt 221.2 lb

## 2016-11-09 DIAGNOSIS — M1A0710 Idiopathic chronic gout, right ankle and foot, without tophus (tophi): Secondary | ICD-10-CM

## 2016-11-09 DIAGNOSIS — Z13220 Encounter for screening for lipoid disorders: Secondary | ICD-10-CM

## 2016-11-09 DIAGNOSIS — K760 Fatty (change of) liver, not elsewhere classified: Secondary | ICD-10-CM | POA: Diagnosis not present

## 2016-11-09 DIAGNOSIS — E6609 Other obesity due to excess calories: Secondary | ICD-10-CM

## 2016-11-09 DIAGNOSIS — Z131 Encounter for screening for diabetes mellitus: Secondary | ICD-10-CM

## 2016-11-09 DIAGNOSIS — Z6834 Body mass index (BMI) 34.0-34.9, adult: Secondary | ICD-10-CM

## 2016-11-09 DIAGNOSIS — G4733 Obstructive sleep apnea (adult) (pediatric): Secondary | ICD-10-CM | POA: Diagnosis not present

## 2016-11-09 DIAGNOSIS — E291 Testicular hypofunction: Secondary | ICD-10-CM

## 2016-11-09 DIAGNOSIS — Z9989 Dependence on other enabling machines and devices: Secondary | ICD-10-CM

## 2016-11-09 DIAGNOSIS — M7712 Lateral epicondylitis, left elbow: Secondary | ICD-10-CM | POA: Diagnosis not present

## 2016-11-09 DIAGNOSIS — E66811 Obesity, class 1: Secondary | ICD-10-CM

## 2016-11-09 MED ORDER — COLCHICINE 0.6 MG PO TABS: 0.6000 mg | ORAL_TABLET | Freq: Two times a day (BID) | ORAL | 3 refills | Status: AC | PRN

## 2016-11-09 MED ORDER — ALLOPURINOL 100 MG PO TABS: 100.0000 mg | ORAL_TABLET | Freq: Two times a day (BID) | ORAL | 5 refills | Status: DC

## 2016-11-09 NOTE — Patient Instructions (Addendum)
IF you received an x-ray today, you will receive an invoice from [facility]. Please contact [facility] at [phone] with questions or concerns regarding your invoice.   IF you received labwork today, you will receive an invoice from [facility]. Please contact [facility] at [phone] with questions or concerns regarding your invoice.   Our billing staff will not be able to assist you with questions regarding bills from these companies.  You will be contacted with the lab results as soon as they are available. The fastest way to get your results is to activate your My Chart account. Instructions are located on the last page of this paperwork. If you have not heard from us regarding the results in 2 weeks, please contact this office.     Tennis Elbow Rehab Ask your health care provider which exercises are safe for you. Do exercises exactly as told by your health care provider and adjust them as directed. It is normal to feel mild stretching, pulling, tightness, or discomfort as you do these exercises, but you should stop right away if you feel sudden pain or your pain gets worse. Do not begin these exercises until told by your health care provider. Stretching and range of motion exercises These exercises warm up your muscles and joints and improve the movement and flexibility of your elbow. These exercises also help to relieve pain, numbness, and tingling. Exercise A: Wrist extensor stretch 1. Extend your left / right elbow with your fingers pointing down. 2. Gently pull the palm of your left / right hand toward you until you feel a gentle stretch on the top of your forearm. 3. To increase the stretch, push your left / right hand toward the outer edge or pinkie side of your forearm. 4. Hold this position for __________ seconds. Repeat __________ times. Complete this exercise __________ times a day. If directed by your health care provider, repeat this stretch except do  it with a bent elbow this time. Exercise B: Wrist flexor stretch 1. Extend your left / right elbow and turn your palm upward. 2. Gently pull your left / right palm and fingertips back so your wrist extends and your fingers point more toward the ground. 3. You should feel a gentle stretch on the inside of your forearm. 4. Hold this position for __________ seconds. Repeat __________ times. Complete this exercise __________ times a day. If directed by your health care provider, repeat this stretch except do it with a bent elbow this time. Strengthening exercises These exercises build strength and endurance in your elbow. Endurance is the ability to use your muscles for a long time, even after they get tired. Exercise C: Wrist extensors 1. Sit with your left / right forearm palm-down and fully supported on a table or countertop. Your elbow should be resting below the height of your shoulder. 2. Let your left / right wrist extend over the edge of the surface. 3. Loosely hold a __________ weight or a piece of rubber exercise band or tubing in your left / right hand. Slowly curl your left / right hand up toward your forearm. If you are using band or tubing, hold the band or tubing in place with your other hand to provide resistance. 4. Hold this position for __________ seconds. 5. Slowly return to the starting position. Repeat __________ times. Complete this exercise __________ times a day. Exercise D: Radial deviators 1. Stand with a __________ weight in your left / righthand. Or, sit while holding  a rubber exercise band or tubing with your other arm supported on a table or countertop. Position your hand so your thumb is on top. 2. Raise your hand upward in front of you so your thumb travels toward your forearm, or pull up on the rubber tubing. 3. Hold this position for __________ seconds. 4. Slowly return to the starting position. Repeat __________ times. Complete this exercise __________ times a  day. Exercise E: Eccentric wrist extensors 1. Sit with your left / right forearm palm-down and fully supported on a table or countertop. Your elbow should be resting below the height of your shoulder. 2. If told by your health care provider, hold a __________ weight in your hand. 3. Let your left / right wrist extend over the edge of the surface. 4. Use your other hand to lift up your left / right hand toward your forearm. Keep your forearm on the table. 5. Using only the muscles in your left / right hand, slowly lower your hand back down to the starting position. Repeat __________ times. Complete this exercise __________ times a day. This information is not intended to replace advice given to you by your health care provider. Make sure you discuss any questions you have with your health care provider. Document Released: 09/09/2005 Document Revised: 05/15/2016 Document Reviewed: 06/08/2015 Elsevier Interactive Patient Education  2017 [employer]

## 2016-11-09 NOTE — Progress Notes (Signed)
Subjective:    Patient ID: Eric Johns, male    DOB: 1956-06-12, 61 y.o.   MRN: [id number]  11/09/2016  Establish Care; Labs Only; and Medication Problem (Gout medicine)   HPI This 61 y.o. male presents to establish care.  Gout: onset 2004 in [place].  No problems for years.  In last year, has had 4 attacks of gout.  Eating colchicine like candy.  Taking llopurinol 100mg  daily.  Elbow B tendonitis: +mild swelling in L lateral elbow.  Did screw off top of jar of olives.  Not sure of etiology.   Seems different but does have swelling.  Wondering if starting to break down physically.Mohter in law getting weaker.  Hand arthritis: cop for 30 years; hand in hand instructor for 10 years; six countries in ten years.  Kmees are worn out; lots of jumping out of planes; [employer] in 1970s.  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  Returned for [place].  Three tours in [place].    Fatty liver: chronic; suffered chemical hepatitis overseas.  Went to oncologist out of [place]; physician in [place] concerned about underlying malignancy.  Hypogonadism: chronic; used shots and creams.  Blood normalized when stopped testosterone for 6 months.    Got a bug in [place] during third tour in [place].  Placed on ivs during the day and returned to work at night. While in [place] last year, got dysentery.  Usually carries Zpack, Cipro.    URI: suffered with recurrent symptoms in 09/2016.  Has provided care to a daughter who was struck by MVA; taught daughter how to walk and talk. Similar incident with son; son hit a tree; cared for son for five months. Third time with caregiving.   Last labs since [place] 04/2015.  Last mission 04/2015.  Had just returned from [place].    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  Sporadic with work.  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  Goes for 8 weeks and come back.  Only 8 week tours.  Had just written curriculum  for [employer].    Review of Systems  Constitutional: Negative for activity change, appetite change, chills, diaphoresis, fatigue and fever.  Eyes: Negative for visual disturbance.  Respiratory: Negative for cough and shortness of breath.   Cardiovascular: Negative for chest pain, palpitations and leg swelling.  Endocrine: Negative for cold intolerance, heat intolerance, polydipsia, polyphagia and polyuria.  Musculoskeletal: Positive for arthralgias, joint swelling and myalgias.  Neurological: Negative for dizziness, tremors, seizures, syncope, facial asymmetry, speech difficulty, weakness, light-headedness, numbness and headaches.    Past Medical History:  Diagnosis Date  . Gout   . Hypogonadism in male   . Mixed hyperlipidemia   . NAFL (nonalcoholic fatty liver)   . Obesity   . OSA on CPAP 06/13/2014  . Sleep apnea   . Snores    Past Surgical History:  Procedure Laterality Date  . APPENDECTOMY  1972   No Known Allergies Current Outpatient Prescriptions  Medication Sig Dispense Refill  . Albuterol Sulfate (PROAIR RESPICLICK) 108 (90 Base) MCG/ACT AEPB Inhale 2 puffs into the lungs every 6 (six) hours as needed. 1 each 0  . allopurinol (ZYLOPRIM) 100 MG tablet Take 1 tablet (100 mg total) by mouth 2 (two) times daily. 60 tablet 5  . colchicine 0.6 MG tablet Take 1 tablet (0.6 mg total) by mouth 2 (two) times daily as  needed. 60 tablet 3  . fluticasone (FLONASE) 50 MCG/ACT nasal spray Place into both nostrils daily.    . Multiple Vitamins-Minerals (MULTIVITAMIN PO) Take by mouth. Taking daily ( [employer] pack)    . aspirin 81 MG tablet Take 81 mg by mouth daily.    . chlorpheniramine-HYDROcodone (TUSSIONEX PENNKINETIC ER) 10-8 MG/5ML SUER Take 5 mLs by mouth every 12 (twelve) hours as needed for cough. (Patient not taking: Reported on 11/09/2016) 100 mL 0   No current facility-administered medications for this visit.    Social History   Social History  . Marital status:  Married    Spouse name: [name]  . Number of children: 3  . Years of education: College   Occupational History  . Not on file.   Social History Main Topics  . Smoking status: Never Smoker  . Smokeless tobacco: Former Neurosurgeon    Types: Snuff    Quit date: 08/24/2007  . Alcohol use 0.6 - 1.2 oz/week    1 - 2 Glasses of wine per week  . Drug use: No  . Sexual activity: Not on file   Other Topics Concern  . Not on file   Social History Narrative   Patient is married [name]) and lives at home with his wife and step-son.   Patient is retired and working part-time - Research scientist (medical) work.   Patient is currently in college for his Master's degree.   Patient is right-handed.   Patient has three children and his wife has two children.   Patient drinks one cup of coffee daily.   Family History  Problem Relation Age of Onset  . Diverticulitis Father   . Diabetes Maternal Grandfather   . Emphysema Maternal Grandfather        Objective:    BP 134/76   Pulse 70   Temp 97.5 F (36.4 C) (Oral)   Resp 16   Ht 5' 7.5" (1.715 m)   Wt 221 lb 3.2 oz (100.3 kg)   SpO2 96%   BMI 34.13 kg/m  Physical Exam  Constitutional: He is oriented to person, place, and time. He appears well-developed and well-nourished. No distress.  HENT:  Head: Normocephalic and atraumatic.  Right Ear: External ear normal.  Left Ear: External ear normal.  Nose: Nose normal.  Mouth/Throat: Oropharynx is clear and moist.  Eyes: Conjunctivae and EOM are normal. Pupils are equal, round, and reactive to light.  Neck: Normal range of motion. Neck supple. Carotid bruit is not present. No thyromegaly present.  Cardiovascular: Normal rate, regular rhythm, normal heart sounds and intact distal pulses.  Exam reveals no gallop and no friction rub.   No murmur heard. Pulmonary/Chest: Effort normal and breath sounds normal. He has no wheezes. He has no rales.  Abdominal: Soft. Bowel sounds are normal. He exhibits no distension  and no mass. There is no tenderness. There is no rebound and no guarding.  Musculoskeletal:       Left elbow: He exhibits swelling. He exhibits normal range of motion. Tenderness found. Lateral epicondyle tenderness noted.  Lymphadenopathy:    He has no cervical adenopathy.  Neurological: He is alert and oriented to person, place, and time. No cranial nerve deficit.  Skin: Skin is warm and dry. No rash noted. He is not diaphoretic.  Psychiatric: He has a normal mood and affect. His behavior is normal.  Nursing note and vitals reviewed.  Results for orders placed or performed in visit on 11/09/16  CBC with Differential/Platelet  Result Value  Ref Range   WBC 7.9 3.4 - 10.8 x10E3/uL   RBC 5.42 4.14 - 5.80 x10E6/uL   Hemoglobin 17.7 13.0 - 17.7 g/dL   Hematocrit 45.450.8 09.837.5 - 51.0 %   MCV 94 79 - 97 fL   MCH 32.7 26.6 - 33.0 pg   MCHC 34.8 31.5 - 35.7 g/dL   RDW 11.914.3 14.712.3 - 82.915.4 %   Platelets 237 150 - 379 x10E3/uL   Neutrophils 49 Not Estab. %   Lymphs 36 Not Estab. %   Monocytes 8 Not Estab. %   Eos 7 Not Estab. %   Basos 0 Not Estab. %   Neutrophils Absolute 3.9 1.4 - 7.0 x10E3/uL   Lymphocytes Absolute 2.9 0.7 - 3.1 x10E3/uL   Monocytes Absolute 0.6 0.1 - 0.9 x10E3/uL   EOS (ABSOLUTE) 0.6 (H) 0.0 - 0.4 x10E3/uL   Basophils Absolute 0.0 0.0 - 0.2 x10E3/uL   Immature Granulocytes 0 Not Estab. %   Immature Grans (Abs) 0.0 0.0 - 0.1 x10E3/uL  Comprehensive metabolic panel  Result Value Ref Range   Glucose 87 65 - 99 mg/dL   BUN 20 8 - 27 mg/dL   Creatinine, Ser 5.621.29 (H) 0.76 - 1.27 mg/dL   GFR calc non Af Amer 59 (L) >59 mL/min/1.73   GFR calc Af Amer 69 >59 mL/min/1.73   BUN/Creatinine Ratio 16 10 - 24   Sodium 141 134 - 144 mmol/L   Potassium 4.6 3.5 - 5.2 mmol/L   Chloride 98 96 - 106 mmol/L   CO2 24 18 - 29 mmol/L   Calcium 10.2 8.6 - 10.2 mg/dL   Total Protein 7.9 6.0 - 8.5 g/dL   Albumin 5.0 (H) 3.6 - 4.8 g/dL   Globulin, Total 2.9 1.5 - 4.5 g/dL   Albumin/Globulin  Ratio 1.7 1.2 - 2.2   Bilirubin Total 0.5 0.0 - 1.2 mg/dL   Alkaline Phosphatase 89 39 - 117 IU/L   AST 52 (H) 0 - 40 IU/L   ALT 88 (H) 0 - 44 IU/L  Hemoglobin A1c  Result Value Ref Range   Hgb A1c MFr Bld 5.5 4.8 - 5.6 %   Est. average glucose Bld gHb Est-mCnc 111 mg/dL  Lipid panel  Result Value Ref Range   Cholesterol, Total 255 (H) 100 - 199 mg/dL   Triglycerides 130389 (H) 0 - 149 mg/dL   HDL 53 >86>39 mg/dL   VLDL Cholesterol Cal 78 (H) 5 - 40 mg/dL   LDL Calculated 578124 (H) 0 - 99 mg/dL   Chol/HDL Ratio 4.8 0.0 - 5.0 ratio units  TSH  Result Value Ref Range   TSH 2.500 0.450 - 4.500 uIU/mL  Uric Acid  Result Value Ref Range   Uric Acid 8.8 (H) 3.7 - 8.6 mg/dL  Testosterone  Result Value Ref Range   Testosterone 311 264 - 916 ng/dL  PSA  Result Value Ref Range   Prostate Specific Ag, Serum 2.4 0.0 - 4.0 ng/mL   Depression screen [facility] 11/09/2016  Decreased Interest 0  Down, Depressed, Hopeless 0  PHQ - 2 Score 0       Assessment & Plan:   1. OSA on CPAP   2. Fatty liver disease, nonalcoholic   3. Chronic idiopathic gout involving toe of right foot without tophus   4. Lateral epicondylitis of left elbow   5. Screening, lipid   6. Screening for diabetes mellitus   7. Hypogonadism in male   8. Class 1 obesity due to excess calories  without serious comorbidity with body mass index (BMI) of 34.0 to 34.9 in adult    -stable. -obtain labs. -rx for Allopurinol provided. -recommend supportive care of L lateral epicondylitis including rest, ice, daily home exercise program. -prolonged face to face for 45 minutes with greater than 50% of time dedicated to counseling and coordination of care.  Orders Placed This Encounter  Procedures  . CBC with Differential/Platelet  . Comprehensive metabolic panel    Order Specific Question:   Has the patient fasted?    Answer:   Yes  . Hemoglobin A1c  . Lipid panel    Order Specific Question:   Has the patient fasted?    Answer:    Yes  . TSH  . Uric Acid  . Testosterone  . PSA   Meds ordered this encounter  Medications  . allopurinol (ZYLOPRIM) 100 MG tablet    Sig: Take 1 tablet (100 mg total) by mouth 2 (two) times daily.    Dispense:  60 tablet    Refill:  5  . colchicine 0.6 MG tablet    Sig: Take 1 tablet (0.6 mg total) by mouth 2 (two) times daily as needed.    Dispense:  60 tablet    Refill:  3    Return in about 6 months (around 05/09/2017) for complete physical examiniation.   Naesha Buckalew Paulita Fujita, M.D. [facility] at [facility] [address] [place], [place]  [address] [phone] phone [phone] fax

## 2016-11-10 LAB — COMPREHENSIVE METABOLIC PANEL
A/G RATIO: 1.7 (ref 1.2–2.2)
ALK PHOS: 89 IU/L (ref 39–117)
ALT: 88 IU/L — AB (ref 0–44)
AST: 52 IU/L — AB (ref 0–40)
Albumin: 5 g/dL — ABNORMAL HIGH (ref 3.6–4.8)
BILIRUBIN TOTAL: 0.5 mg/dL (ref 0.0–1.2)
BUN/Creatinine Ratio: 16 (ref 10–24)
BUN: 20 mg/dL (ref 8–27)
CHLORIDE: 98 mmol/L (ref 96–106)
CO2: 24 mmol/L (ref 18–29)
Calcium: 10.2 mg/dL (ref 8.6–10.2)
Creatinine, Ser: 1.29 mg/dL — ABNORMAL HIGH (ref 0.76–1.27)
GFR calc non Af Amer: 59 mL/min/{1.73_m2} — ABNORMAL LOW (ref >59)
GFR, EST AFRICAN AMERICAN: 69 mL/min/{1.73_m2} (ref >59)
Globulin, Total: 2.9 g/dL (ref 1.5–4.5)
Glucose: 87 mg/dL (ref 65–99)
POTASSIUM: 4.6 mmol/L (ref 3.5–5.2)
Sodium: 141 mmol/L (ref 134–144)
Total Protein: 7.9 g/dL (ref 6.0–8.5)

## 2016-11-10 LAB — CBC WITH DIFFERENTIAL/PLATELET
BASOS ABS: 0 10*3/uL (ref 0.0–0.2)
BASOS: 0 %
EOS (ABSOLUTE): 0.6 10*3/uL — AB (ref 0.0–0.4)
Eos: 7 %
Hematocrit: 50.8 % (ref 37.5–51.0)
Hemoglobin: 17.7 g/dL (ref 13.0–17.7)
Immature Grans (Abs): 0 10*3/uL (ref 0.0–0.1)
Immature Granulocytes: 0 %
Lymphocytes Absolute: 2.9 10*3/uL (ref 0.7–3.1)
Lymphs: 36 %
MCH: 32.7 pg (ref 26.6–33.0)
MCHC: 34.8 g/dL (ref 31.5–35.7)
MCV: 94 fL (ref 79–97)
MONOS ABS: 0.6 10*3/uL (ref 0.1–0.9)
Monocytes: 8 %
NEUTROS ABS: 3.9 10*3/uL (ref 1.4–7.0)
Neutrophils: 49 %
PLATELETS: 237 10*3/uL (ref 150–379)
RBC: 5.42 x10E6/uL (ref 4.14–5.80)
RDW: 14.3 % (ref 12.3–15.4)
WBC: 7.9 10*3/uL (ref 3.4–10.8)

## 2016-11-10 LAB — LIPID PANEL
CHOLESTEROL TOTAL: 255 mg/dL — AB (ref 100–199)
Chol/HDL Ratio: 4.8 ratio units (ref 0.0–5.0)
HDL: 53 mg/dL (ref >39)
LDL CALC: 124 mg/dL — AB (ref 0–99)
Triglycerides: 389 mg/dL — ABNORMAL HIGH (ref 0–149)
VLDL Cholesterol Cal: 78 mg/dL — ABNORMAL HIGH (ref 5–40)

## 2016-11-10 LAB — HEMOGLOBIN A1C
Est. average glucose Bld gHb Est-mCnc: 111 mg/dL
HEMOGLOBIN A1C: 5.5 % (ref 4.8–5.6)

## 2016-11-10 LAB — TESTOSTERONE: TESTOSTERONE: 311 ng/dL (ref 264–916)

## 2016-11-10 LAB — TSH: TSH: 2.5 u[IU]/mL (ref 0.450–4.500)

## 2016-11-10 LAB — URIC ACID: URIC ACID: 8.8 mg/dL — AB (ref 3.7–8.6)

## 2016-11-10 LAB — PSA: Prostate Specific Ag, Serum: 2.4 ng/mL (ref 0.0–4.0)

## 2016-11-29 ENCOUNTER — Encounter: Payer: Self-pay | Admitting: Family Medicine

## 2016-11-29 DIAGNOSIS — Z6834 Body mass index (BMI) 34.0-34.9, adult: Secondary | ICD-10-CM

## 2016-11-29 DIAGNOSIS — E291 Testicular hypofunction: Secondary | ICD-10-CM | POA: Insufficient documentation

## 2016-11-29 DIAGNOSIS — E6609 Other obesity due to excess calories: Secondary | ICD-10-CM | POA: Insufficient documentation

## 2016-12-28 ENCOUNTER — Encounter: Payer: Self-pay | Admitting: Family Medicine

## 2016-12-28 ENCOUNTER — Ambulatory Visit (INDEPENDENT_AMBULATORY_CARE_PROVIDER_SITE_OTHER): Payer: BC Managed Care – PPO | Admitting: Family Medicine

## 2016-12-28 VITALS — BP 121/75 | HR 69 | Resp 18 | Ht 67.0 in | Wt 223.0 lb

## 2016-12-28 DIAGNOSIS — E291 Testicular hypofunction: Secondary | ICD-10-CM | POA: Diagnosis not present

## 2016-12-28 DIAGNOSIS — K760 Fatty (change of) liver, not elsewhere classified: Secondary | ICD-10-CM

## 2016-12-28 DIAGNOSIS — D721 Eosinophilia: Secondary | ICD-10-CM

## 2016-12-28 DIAGNOSIS — R898 Other abnormal findings in specimens from other organs, systems and tissues: Secondary | ICD-10-CM

## 2016-12-28 DIAGNOSIS — Z0000 Encounter for general adult medical examination without abnormal findings: Secondary | ICD-10-CM

## 2016-12-28 DIAGNOSIS — Z9989 Dependence on other enabling machines and devices: Secondary | ICD-10-CM | POA: Diagnosis not present

## 2016-12-28 DIAGNOSIS — G4733 Obstructive sleep apnea (adult) (pediatric): Secondary | ICD-10-CM

## 2016-12-28 DIAGNOSIS — Z7189 Other specified counseling: Secondary | ICD-10-CM | POA: Diagnosis not present

## 2016-12-28 DIAGNOSIS — Z6834 Body mass index (BMI) 34.0-34.9, adult: Secondary | ICD-10-CM

## 2016-12-28 DIAGNOSIS — Z7184 Encounter for health counseling related to travel: Secondary | ICD-10-CM

## 2016-12-28 DIAGNOSIS — M7712 Lateral epicondylitis, left elbow: Secondary | ICD-10-CM | POA: Diagnosis not present

## 2016-12-28 DIAGNOSIS — E6609 Other obesity due to excess calories: Secondary | ICD-10-CM | POA: Diagnosis not present

## 2016-12-28 DIAGNOSIS — M1A0710 Idiopathic chronic gout, right ankle and foot, without tophus (tophi): Secondary | ICD-10-CM

## 2016-12-28 LAB — POCT URINALYSIS DIP (MANUAL ENTRY)
BILIRUBIN UA: NEGATIVE
GLUCOSE UA: NEGATIVE
Ketones, POC UA: NEGATIVE
Leukocytes, UA: NEGATIVE
Nitrite, UA: NEGATIVE
PH UA: 6 (ref 5.0–8.0)
PROTEIN UA: NEGATIVE
Spec Grav, UA: 1.015 (ref 1.030–1.035)
UROBILINOGEN UA: 0.2 (ref ?–2.0)

## 2016-12-28 MED ORDER — AZITHROMYCIN 500 MG PO TABS: 500.0000 mg | ORAL_TABLET | Freq: Every day | ORAL | 1 refills | Status: DC

## 2016-12-28 MED ORDER — CIPROFLOXACIN HCL 500 MG PO TABS: 500.0000 mg | ORAL_TABLET | Freq: Two times a day (BID) | ORAL | 0 refills | Status: DC

## 2016-12-28 MED ORDER — PREDNISONE 20 MG PO TABS: ORAL_TABLET | ORAL | 0 refills | Status: DC

## 2016-12-28 MED ORDER — TESTOSTERONE 20.25 MG/ACT (1.62%) TD GEL: 50.0000 mg | Freq: Every day | TRANSDERMAL | 5 refills | Status: DC

## 2016-12-28 NOTE — Patient Instructions (Signed)
Preventive Care 40-64 Years, Male Preventive care refers to lifestyle choices and visits with your health care provider that can promote health and wellness. What does preventive care include?  A yearly physical exam. This is also called an annual well check.  Dental exams once or twice a year.  Routine eye exams. Ask your health care provider how often you should have your eyes checked.  Personal lifestyle choices, including:  Daily care of your teeth and gums.  Regular physical activity.  Eating a healthy diet.  Avoiding tobacco and drug use.  Limiting alcohol use.  Practicing safe sex.  Taking low-dose aspirin every day starting at age 50. What happens during an annual well check? The services and screenings done by your health care provider during your annual well check will depend on your age, overall health, lifestyle risk factors, and family history of disease. Counseling  Your health care provider may ask you questions about your:  Alcohol use.  Tobacco use.  Drug use.  Emotional well-being.  Home and relationship well-being.  Sexual activity.  Eating habits.  Work and work environment. Screening  You may have the following tests or measurements:  Height, weight, and BMI.  Blood pressure.  Lipid and cholesterol levels. These may be checked every 5 years, or more frequently if you are over 50 years old.  Skin check.  Lung cancer screening. You may have this screening every year starting at age 55 if you have a 30-pack-year history of smoking and currently smoke or have quit within the past 15 years.  Fecal occult blood test (FOBT) of the stool. You may have this test every year starting at age 50.  Flexible sigmoidoscopy or colonoscopy. You may have a sigmoidoscopy every 5 years or a colonoscopy every 10 years starting at age 50.  Prostate cancer screening. Recommendations will vary depending on your family history and other risks.  Hepatitis C  blood test.  Hepatitis B blood test.  Sexually transmitted disease (STD) testing.  Diabetes screening. This is done by checking your blood sugar (glucose) after you have not eaten for a while (fasting). You may have this done every 1-3 years. Discuss your test results, treatment options, and if necessary, the need for more tests with your health care provider. Vaccines  Your health care provider may recommend certain vaccines, such as:  Influenza vaccine. This is recommended every year.  Tetanus, diphtheria, and acellular pertussis (Tdap, Td) vaccine. You may need a Td booster every 10 years.  Varicella vaccine. You may need this if you have not been vaccinated.  Zoster vaccine. You may need this after age 60.  Measles, mumps, and rubella (MMR) vaccine. You may need at least one dose of MMR if you were born in 1957 or later. You may also need a second dose.  Pneumococcal 13-valent conjugate (PCV13) vaccine. You may need this if you have certain conditions and have not been vaccinated.  Pneumococcal polysaccharide (PPSV23) vaccine. You may need one or two doses if you smoke cigarettes or if you have certain conditions.  Meningococcal vaccine. You may need this if you have certain conditions.  Hepatitis A vaccine. You may need this if you have certain conditions or if you travel or work in places where you may be exposed to hepatitis A.  Hepatitis B vaccine. You may need this if you have certain conditions or if you travel or work in places where you may be exposed to hepatitis B.  Haemophilus influenzae type b (Hib)   vaccine. You may need this if you have certain risk factors. Talk to your health care provider about which screenings and vaccines you need and how often you need them. This information is not intended to replace advice given to you by your health care provider. Make sure you discuss any questions you have with your health care provider. Document Released: 10/06/2015  Document Revised: 05/29/2016 Document Reviewed: 07/11/2015 Elsevier Interactive Patient Education  2017 [employer].

## 2016-12-28 NOTE — Progress Notes (Signed)
Subjective:    Patient ID: Eric Johns, male    DOB: [birth year], 61 y.o.   MRN: [id number]  12/28/2016  Annual Exam   HPI This 61 y.o. male presents for Complete Physical Examination with multiple other concerns.  Last physical: 2016 Colonoscopy: [place]; few years ago; return in 5 years ago.  PSA: 2018  Hypogonadism:  Requesting to restart testosterone supplementation which was discontinued in the past due to elevated H/H.  Injectable testosterone held and H/H returned to normal.  Referred to Dr. Sherwood Gambler who referred to [facility] who referred to oncologist in [place].  Stopped testosterone which returned everything to normal.  Interested in Androgel.   Injections in past of testosterone twice per month. 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  At the end of the month, will be doing two week stents for training.  Commander course for conflict areas.  Will not have access to inject shots.  Has never self injected.  Not excited about shots.  Interested in shots to start with.  Must lose 30 pounds; must lose weight in upcoming environment.  Worst shape of life right now.  Really embarrassing.   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  Desired weight is 185.    OSA: on CPAP; considering travel CPAP; much smaller and easier.  Really expensive travel; needs an adapter.    L lateral epicondylitis: has old Prednisone rx; self medicated with  two weeks ago; pain completely resolved; then tapered down.  Pain recurred with  after two days. Requesting rx for Prednisone.    Visual Acuity Screening   Right eye Left eye Both eyes  Without correction:     With correction:     There is no immunization history on file for this patient. BP Readings from Last 3 Encounters:  12/28/16 121/75  11/09/16 134/76  08/28/16 122/72   Wt Readings from Last 3 Encounters:  12/28/16 223 lb (101.2 kg)  11/09/16 221 lb 3.2 oz  (100.3 kg)  08/28/16 224 lb (101.6 kg)    Review of Systems  Constitutional: Negative for activity change, appetite change, chills, diaphoresis, fatigue, fever and unexpected weight change.  HENT: Negative for congestion, dental problem, drooling, ear discharge, ear pain, facial swelling, hearing loss, mouth sores, nosebleeds, postnasal drip, rhinorrhea, sinus pressure, sneezing, sore throat, tinnitus, trouble swallowing and voice change.   Eyes: Negative for photophobia, pain, discharge, redness, itching and visual disturbance.  Respiratory: Negative for apnea, cough, choking, chest tightness, shortness of breath, wheezing and stridor.   Cardiovascular: Negative for chest pain, palpitations and leg swelling.  Gastrointestinal: Negative for abdominal pain, blood in stool, constipation, diarrhea, nausea and vomiting.  Endocrine: Negative for cold intolerance, heat intolerance, polydipsia, polyphagia and polyuria.  Genitourinary: Negative for decreased urine volume, difficulty urinating, discharge, dysuria, enuresis, flank pain, frequency, genital sores, hematuria, penile pain, penile swelling, scrotal swelling, testicular pain and urgency.       Nocturia x 0.  Decreased urinary stream.    Musculoskeletal: Positive for arthralgias. Negative for back pain, gait problem, joint swelling, myalgias, neck pain and neck stiffness.  Skin: Negative for color change, pallor, rash and wound.  Allergic/Immunologic: Negative for environmental allergies, food allergies and immunocompromised state.  Neurological: Negative for dizziness, tremors, seizures, syncope, facial asymmetry, speech difficulty, weakness, light-headedness, numbness and headaches.  Hematological: Negative for adenopathy. Does not bruise/bleed easily.  Psychiatric/Behavioral: Negative for agitation, behavioral problems, confusion, decreased concentration,  dysphoric mood, hallucinations, self-injury, sleep disturbance and suicidal ideas. The  patient is not nervous/anxious and is not hyperactive.        Bedtime 12a; wakes up 6a    Past Medical History:  Diagnosis Date  . Gout   . Hypogonadism in male   . Mixed hyperlipidemia   . NAFL (nonalcoholic fatty liver)   . Obesity   . OSA on CPAP 06/13/2014  . Sleep apnea   . Snores    Past Surgical History:  Procedure Laterality Date  . APPENDECTOMY  1972   No Known Allergies  Social History   Social History  . Marital status: Married    Spouse name: [name]  . Number of children: 3  . Years of education: College   Occupational History  . employed    Social History Main Topics  . Smoking status: Never Smoker  . Smokeless tobacco: Former Neurosurgeon    Types: Snuff    Quit date: 08/24/2007  . Alcohol use 0.6 - 1.2 oz/week    1 - 2 Glasses of wine per week  . Drug use: No  . Sexual activity: Not Currently   Other Topics Concern  . Not on file   Social History Narrative   Patient is married [name]) and lives at home with his wife and step-son.   Patient is retired and working part-time - Research scientist (medical) work.   Patient is currently in college for his Master's degree.   Patient is right-handed.   Patient has three children and his wife has two children.   Patient drinks one cup of coffee daily.   Family History  Problem Relation Age of Onset  . Diverticulitis Father   . Diabetes Maternal Grandfather   . Emphysema Maternal Grandfather        Objective:    BP 121/75   Pulse 69   Resp 18   Ht  (1.702 m)   Wt 223 lb (101.2 kg)   BMI 34.93 kg/m  Physical Exam  Constitutional: He is oriented to person, place, and time. He appears well-developed and well-nourished. No distress.  HENT:  Head: Normocephalic and atraumatic.  Right Ear: External ear normal.  Left Ear: External ear normal.  Nose: Nose normal.  Mouth/Throat: Oropharynx is clear and moist.  Eyes: Conjunctivae and EOM are normal. Pupils are equal, round, and reactive to light.  Neck: Normal  range of motion. Neck supple. Carotid bruit is not present. No thyromegaly present.  Cardiovascular: Normal rate, regular rhythm, normal heart sounds and intact distal pulses.  Exam reveals no gallop and no friction rub.   No murmur heard. Pulmonary/Chest: Effort normal and breath sounds normal. He has no wheezes. He has no rales.  Abdominal: Soft. Bowel sounds are normal. He exhibits no distension and no mass. There is no tenderness. There is no rebound and no guarding.  Genitourinary: Rectum normal, prostate normal and penis normal.  Musculoskeletal:       Right shoulder: Normal.       Left shoulder: Normal.       Cervical back: Normal.  Lymphadenopathy:    He has no cervical adenopathy.  Neurological: He is alert and oriented to person, place, and time. He has normal reflexes. No cranial nerve deficit. He exhibits normal muscle tone. Coordination normal.  Skin: Skin is warm and dry. No rash noted. He is not diaphoretic.  Psychiatric: He has a normal mood and affect. His behavior is normal. Judgment and thought content normal.  Depression screen PHQ 2/9 12/28/2016 11/09/2016  Decreased Interest 0 0  Down, Depressed, Hopeless 0 0  PHQ - 2 Score 0 0   Fall Risk  12/28/2016 11/09/2016  Falls in the past year? No No       Assessment & Plan:   1. Routine physical examination   2. OSA on CPAP   3. Fatty liver disease, nonalcoholic   4. Hypogonadism in male   5. Chronic idiopathic gout involving toe of right foot without tophus   6. Class 1 obesity due to excess calories without serious comorbidity with body mass index (BMI) of 34.0 to 34.9 in adult   7. Eosinophil count raised   8. Lateral epicondylitis of left elbow    -anticipatory guidance ---exercise, weight loss, ASA  daily. -reviewed recent labs in detail with patient. -currently suffering with persistent lateral epicondylitis; rx for Prednisone provided; continue supportive care with rest, stretches. -upcoming travel so  provided with rx for Zithromax and Cipro. -agreeable to treating borderline testosterone level with androgel to assist with exercise and weight loss; discussed risk and benefits of supplementation including DVTs.    Orders Placed This Encounter  Procedures  . POCT urinalysis dipstick   Meds ordered this encounter  Medications  . Melatonin 3 MG TABS    Sig: Take by mouth.  . predniSONE (DELTASONE) 20 MG tablet    Sig: Take 3 PO QAM x 1 day, 2 PO QAM x 5 days, 1 PO QAM x 5 days    Dispense:  18 tablet    Refill:  0  . ciprofloxacin (CIPRO) 500 MG tablet    Sig: Take 1 tablet (500 mg total) by mouth 2 (two) times daily.    Dispense:  20 tablet    Refill:  0  . azithromycin (ZITHROMAX) 500 MG tablet    Sig: Take 1 tablet (500 mg total) by mouth daily.    Dispense:  5 tablet    Refill:  1  . Testosterone (ANDROGEL PUMP) 20.25 MG/ACT (1.62%) GEL    Sig: Place 50 mg onto the skin daily.    Dispense:  75 g    Refill:  5    Return in about 3 months (around 03/29/2017) for testosterone.   Taunia Frasco Paulita Fujita, M.D. Primary Care at [facility] previously [facility] [address] [place], [place]  [address] [phone] [facility] [phone] fax

## 2017-01-11 ENCOUNTER — Telehealth: Payer: Self-pay

## 2017-01-11 ENCOUNTER — Encounter: Payer: Self-pay | Admitting: Family Medicine

## 2017-01-11 NOTE — Telephone Encounter (Signed)
androgel needs PA [phone]

## 2017-01-29 ENCOUNTER — Telehealth: Payer: Self-pay

## 2017-01-29 NOTE — Telephone Encounter (Signed)
Worked on PA for patient's androgel.  Request was sent from pharmacy yesterday.  Androgel was approved.  Patient notified.  He was very Adult nurseappreciative.

## 2017-01-30 ENCOUNTER — Telehealth: Payer: Self-pay | Admitting: Family Medicine

## 2017-01-30 NOTE — Telephone Encounter (Signed)
[facility] [place] [facility] for [facility] on pt's androgel 50mg  on skin daily.  Pharmacy has a 40.5mg .  Please advise [phone]

## 2017-01-31 NOTE — Telephone Encounter (Signed)
Please clarify dose

## 2017-01-31 NOTE — Telephone Encounter (Signed)
Please call--- fine to change to 40.5mg  daily.

## 2017-01-31 NOTE — Telephone Encounter (Signed)
Called with ok to change androgel

## 2017-03-10 ENCOUNTER — Ambulatory Visit: Payer: BC Managed Care – PPO | Admitting: Neurology

## 2017-03-21 ENCOUNTER — Ambulatory Visit (INDEPENDENT_AMBULATORY_CARE_PROVIDER_SITE_OTHER): Payer: BC Managed Care – PPO | Admitting: Family Medicine

## 2017-03-21 ENCOUNTER — Encounter: Payer: Self-pay | Admitting: Family Medicine

## 2017-03-21 VITALS — BP 129/79 | HR 78 | Temp 98.5°F | Resp 18 | Ht 67.0 in | Wt 215.0 lb

## 2017-03-21 DIAGNOSIS — Z298 Encounter for other specified prophylactic measures: Secondary | ICD-10-CM

## 2017-03-21 DIAGNOSIS — Z630 Problems in relationship with spouse or partner: Secondary | ICD-10-CM | POA: Diagnosis not present

## 2017-03-21 DIAGNOSIS — Z7189 Other specified counseling: Secondary | ICD-10-CM | POA: Diagnosis not present

## 2017-03-21 DIAGNOSIS — Z7184 Encounter for health counseling related to travel: Secondary | ICD-10-CM

## 2017-03-21 DIAGNOSIS — I781 Nevus, non-neoplastic: Secondary | ICD-10-CM

## 2017-03-21 MED ORDER — DOXYCYCLINE HYCLATE 100 MG PO CAPS: 100.0000 mg | ORAL_CAPSULE | Freq: Every day | ORAL | 0 refills | Status: DC

## 2017-03-21 NOTE — Progress Notes (Signed)
Subjective:    Patient ID: Eric Johns, male    DOB: 02-12-1956, 61 y.o.   MRN: [id number]  03/21/2017  Medication Refill (doxcycline); Immunizations (needs tetanus and a hep A ); Rabies Injection (wants to discuss rabies ); and Mass (on right cheek )   HPI This 61 y.o. male presents for three month follow-up and for immunization/travel advice.  Also has skin lesion R face.  Immunizations: needs immunizations boosted for upcoming international travel.   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   Skin lesion facial: present for several months; no pain; no drainage; concerned of skin cancer.  Marital strain: wife diagnosed with HPV on pap smear and convinced that pt infected her with it.  Pt wants to make sure he cannot reinfect his wife with HPV.  No longer intimate with wife.      BP Readings from Last 3 Encounters:  03/21/17 129/79  12/28/16 121/75  11/09/16 134/76   Wt Readings from Last 3 Encounters:  03/21/17 215 lb (97.5 kg)  12/28/16 223 lb (101.2 kg)  11/09/16 221 lb 3.2 oz (100.3 kg)    There is no immunization history on file for this patient.   Review of Systems  Constitutional: Negative for activity change, appetite change, chills, diaphoresis, fatigue and fever.  Respiratory: Negative for cough and shortness of breath.   Cardiovascular: Negative for chest pain, palpitations and leg swelling.  Gastrointestinal: Negative for abdominal pain, diarrhea, nausea and vomiting.  Endocrine: Negative for cold intolerance, heat intolerance, polydipsia, polyphagia and polyuria.  Skin: Positive for color change. Negative for rash and wound.  Neurological: Negative for dizziness, tremors, seizures, syncope, facial asymmetry, speech difficulty, weakness, light-headedness, numbness and headaches.  Psychiatric/Behavioral: Negative for dysphoric mood and sleep disturbance. The patient is not nervous/anxious.     Past Medical History:  Diagnosis Date   . Gout   . Hypogonadism in male   . Mixed hyperlipidemia   . NAFL (nonalcoholic fatty liver)   . Obesity   . OSA on CPAP 06/13/2014   Past Surgical History:  Procedure Laterality Date  . APPENDECTOMY  1972   No Known Allergies  Social History   Social History  . Marital status: Married    Spouse name: [name]  . Number of children: 3  . Years of education: College   Occupational History  . employed    Social History Main Topics  . Smoking status: Never Smoker  . Smokeless tobacco: Former Neurosurgeon    Types: Snuff    Quit date: 08/24/2007  . Alcohol use 0.6 - 1.2 oz/week    1 - 2 Glasses of wine per week  . Drug use: No  . Sexual activity: Not Currently   Other Topics Concern  . Not on file   Social History Narrative   Patient is married [name]) and lives at home with his wife and step-son.   Patient is retired and working part-time - Research scientist (medical) work.   Patient is currently in college for his Master's degree.   Patient is right-handed.   Patient has three children and his wife has two children.   Patient drinks one cup of coffee daily.   Family History  Problem Relation Age of Onset  . Diverticulitis Father   . Diabetes Maternal Grandfather   . Emphysema Maternal Grandfather        Objective:    BP 129/79   Pulse 78   Temp 98.5 F (36.9 C) (Oral)  Resp 18   Ht 5\' 7"  (1.702 m)   Wt 215 lb (97.5 kg)   SpO2 94%   BMI 33.67 kg/m  Physical Exam  Constitutional: He is oriented to person, place, and time. He appears well-developed and well-nourished. No distress.  HENT:  Head: Atraumatic. Macrocephalic.    Right Ear: External ear normal.  Left Ear: External ear normal.  Nose: Nose normal.  Mouth/Throat: Oropharynx is clear and moist.  Eyes: Conjunctivae and EOM are normal. Pupils are equal, round, and reactive to light.  Neck: Normal range of motion. Neck supple. Carotid bruit is not present. No thyromegaly present.  Cardiovascular: Normal rate,  regular rhythm, normal heart sounds and intact distal pulses.  Exam reveals no gallop and no friction rub.   No murmur heard. Pulmonary/Chest: Effort normal and breath sounds normal. He has no wheezes. He has no rales.  Abdominal: Soft. Bowel sounds are normal. He exhibits no distension and no mass. There is no tenderness. There is no rebound and no guarding.  Lymphadenopathy:    He has no cervical adenopathy.  Neurological: He is alert and oriented to person, place, and time. No cranial nerve deficit.  Skin: Skin is warm and dry. No rash noted. He is not diaphoretic.  R maxillary region with 3 mm non-blanching skin lesion without pustule or vesicle or scaling; non-tender.  Psychiatric: He has a normal mood and affect. His behavior is normal.  Nursing note and vitals reviewed.  Results for orders placed or performed in visit on 12/28/16  POCT urinalysis dipstick  Result Value Ref Range   Color, UA yellow yellow   Clarity, UA clear clear   Glucose, UA negative negative   Bilirubin, UA negative negative   Ketones, POC UA negative negative   Spec Grav, UA 1.015 1.030 - 1.035   Blood, UA trace-intact (A) negative   pH, UA 6.0 5.0 - 8.0   Protein Ur, POC negative negative   Urobilinogen, UA 0.2 Negative - 2.0   Nitrite, UA Negative Negative   Leukocytes, UA Negative Negative       Assessment & Plan:   1. Telangiectasia of face   2. Travel advice encounter   3. Marital conflict   4. Need for malaria prophylaxis    -new onset telangiectasia of face. Offered dermatology consultation yet pt declined at this time. -upcoming travel; reviewed immunization records and UTD on all immunizations. -rx for Doxycycline for malaria prophylaxis per patient request.   No orders of the defined types were placed in this encounter.  Meds ordered this encounter  Medications  . doxycycline (VIBRAMYCIN) 100 MG capsule    Sig: Take 1 capsule (100 mg total) by mouth daily. Start 48 hours prior to  departure; continue for four weeks after return    Dispense:  60 capsule    Refill:  0    No Follow-up on file.   Seda Kronberg Paulita FujitaMartin Elany Felix, M.D. [facility] at [facility]  [place] previously [facility] [address] [place], [place]  [id number] [phone] phone [phone] fax

## 2017-03-21 NOTE — Patient Instructions (Signed)
     IF you received an x-ray today, you will receive an invoice from [facility]. Please contact [facility] at [phone] with questions or concerns regarding your invoice.   IF you received labwork today, you will receive an invoice from [facility]. Please contact [facility] at [phone] with questions or concerns regarding your invoice.   Our billing staff will not be able to assist you with questions regarding bills from these companies.  You will be contacted with the lab results as soon as they are available. The fastest way to get your results is to activate your My Chart account. Instructions are located on the last page of this paperwork. If you have not heard from us regarding the results in 2 weeks, please contact this office.     

## 2017-04-14 ENCOUNTER — Encounter: Payer: Self-pay | Admitting: Neurology

## 2017-04-21 ENCOUNTER — Ambulatory Visit: Payer: BC Managed Care – PPO | Admitting: Neurology

## 2017-05-06 ENCOUNTER — Encounter: Payer: Self-pay | Admitting: Neurology

## 2017-05-06 ENCOUNTER — Ambulatory Visit (INDEPENDENT_AMBULATORY_CARE_PROVIDER_SITE_OTHER): Payer: BC Managed Care – PPO | Admitting: Neurology

## 2017-05-06 VITALS — BP 124/85 | HR 69 | Ht 68.0 in | Wt 216.0 lb

## 2017-05-06 DIAGNOSIS — G4725 Circadian rhythm sleep disorder, jet lag type: Secondary | ICD-10-CM | POA: Diagnosis not present

## 2017-05-06 DIAGNOSIS — G4733 Obstructive sleep apnea (adult) (pediatric): Secondary | ICD-10-CM

## 2017-05-06 DIAGNOSIS — Z9989 Dependence on other enabling machines and devices: Secondary | ICD-10-CM | POA: Diagnosis not present

## 2017-05-06 NOTE — Progress Notes (Signed)
SLEEP MEDICINE CLINIC   Provider:  Melvyn Novas, M D  Referring Provider: Elfredia Nevins, MD Primary Care Physician:  Ethelda Chick, MD  Chief Complaint  Patient presents with  . Follow-up    pt alone, cpap working well. waking up overnight sweating thru his shirt. pt states that [place] and that he contributes to stress and was trying to monitor it since being home. CPAP going well. ? about a portable unit.     HPI:  Eric Johns is a 62 y.o. male,  Seen in a Revisit on 05/06/2017, Eric Johns is a compliant CPAP user who has used a CPAP for the last 30 days it 100%, his AHI is 4.9, his using an AutoSet between 4 and 10 cm water with 3 cm expiratory pressure relief the pressure at the 95th percentile is 9.7 cm. There is room to increase the pressure a little bit more. I'm very happy with his compliance time an average of 6 hours and 16 minutes each night. He may need 12 cm water. He needs to use a travel friendly machine.  He worked in [place] last week, he worked in [place] the month before. He will buy a travel CPAP outright.     Patient was  seen here as a revisit from Dr. Sherwood Gambler for a sleep consult. 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 He lived in [place], in [place], This frequent traveling and living at primitive circumstances has made the use of his current CPAP  difficult. He was diagnosed several years ago with obstructive sleep apnea and a CPAP machine was prescribed at 8 cm water pressure. The study was performed at [facility] in [place], [place].  His current machine he has used for 6 years and this is the second machine he has over all and this diagnosis it is likely more than a decade ago. Because of the restrictions with carry-on luggage and living circumstances and some second [place] countries he has not limited to CPAP and was in contact with a dentist. He received a dental device is some  ulnad these retainer. This may have triggered his snoring, but he by varying the device he would be sweat drenched- he noted that when waking up. It is very likely that he had tachycardia and bradycardia arrhythmias due to  oxygen deficiency when using a "snoring" device only.  He is expecting to be traveling through [place], and would like to be evaluated first.  Was able today to access the download data package from the patient CPAP machine . The data collection begun 03-15-14 at and ended on  06-12-14 the patient's machine is set at 8 cm water the median air leak is 14.4 L per minute with residual AHI was 8.2. I was a little bit high.  His average times daily spent with CPAP user 6 hours and 6 minutes and - fully complies with the requirements of his insurance carrier. 86% compliance over 4 hours. No a FFM user.He never heard of nasal pillows. He estimated his nocturnal sleep time 6-7 hours, bedtime 11 , rise time 6.30 on average , one bathroom break. Sleeps alone. Dry mouth . 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  Eric Johns's CPAP data are impressive;  he's 100% compliant for the last 30 days the average user time is 6 hours and 57 minutes. He is using an AutoSet between 4 and 8 cm water pressure with 3 cm EPR. AHI is 6.4 which is a little higher than I like. He does not have significant air leaks. The 95th percentile pressure used is now 7.9 cm. This leads to see assumption that we need to raise his pressure window. [facility] increase his maximum pressure to 10 cm water for that to his room. His last pressure was determined after a split-night polysomnography dated 28th of October  2015 that the patient's diagnostic AHI was 25.8 in supine sleep 31.4 and there was no REM sleep seen in the diagnostic portion oxygen nadir during the study was 84% saturation with a total desaturation time of 55.6 minutes. Titration study reduce the oxygen desaturations significantly and the overall AHI was 3.2 at 8 cm water.   Review of Systems: Out of a complete 14 system review, the patient complains of only the following symptoms, and all other reviewed systems are negative. no longer snoring, still travelling internationally- time zone crossing Epworth score from 10 , on CPAP   Fatigue severity score still 10  , depression score 3   Social History   Social History  . Marital status: Married    Spouse name: Marylene Landngela  . Number of children: 3  . Years of education: College   Occupational History  . employed    Social History Main Topics  . Smoking status: Never Smoker  . Smokeless tobacco: Former NeurosurgeonUser    Types: Snuff    Quit date: 08/24/2007  . Alcohol use 0.6 - 1.2 oz/week    1 - 2 Glasses of wine per week  . Drug use: No  . Sexual activity: Not Currently   Other Topics Concern  . Not on file   Social History Narrative   Patient is married [name](Angela) and lives at home with his wife and step-son.   Patient is retired and working part-time - Research scientist (medical)Consultant work.   Patient is currently in college for his Master's degree.   Patient is right-handed.   Patient has three children and his wife has two children.   Patient drinks one cup of coffee daily.    Family History  Problem Relation Age of Onset  . Diverticulitis Father   . Diabetes Maternal Grandfather   . Emphysema Maternal Grandfather     Past Medical History:  Diagnosis Date  . Gout   . Hypogonadism in male   . Mixed hyperlipidemia   . NAFL (nonalcoholic fatty liver)   . Obesity   . OSA on CPAP 06/13/2014    Past Surgical History:  Procedure Laterality Date  . APPENDECTOMY  1972    Current Outpatient  Prescriptions  Medication Sig Dispense Refill  . Albuterol Sulfate (PROAIR RESPICLICK) 108 (90 Base) MCG/ACT AEPB Inhale 2 puffs into the lungs every 6 (six) hours as needed. 1 each 0  . allopurinol (ZYLOPRIM) 100 MG tablet Take 1 tablet (100 mg total) by mouth 2 (two) times daily. 60 tablet 5  . aspirin 81 MG tablet Take 81 mg by mouth daily.    Marland Kitchen. azithromycin (ZITHROMAX) 500 MG tablet Take 1 tablet (500 mg total) by mouth daily. 5 tablet 1  . ciprofloxacin (CIPRO) 500 MG tablet  Take 1 tablet (500 mg total) by mouth 2 (two) times daily. 20 tablet 0  . colchicine 0.6 MG tablet Take 1 tablet (0.6 mg total) by mouth 2 (two) times daily as needed. 60 tablet 3  . doxycycline (VIBRAMYCIN) 100 MG capsule Take 1 capsule (100 mg total) by mouth daily. Start 48 hours prior to departure; continue for four weeks after return 60 capsule 0  . fluticasone (FLONASE) 50 MCG/ACT nasal spray Place into both nostrils daily.    . Melatonin 3 MG TABS Take by mouth.    . Multiple Vitamins-Minerals (MULTIVITAMIN PO) Take by mouth. Taking daily ( [employer] pack)    . Testosterone (ANDROGEL PUMP) 20.25 MG/ACT (1.62%) GEL Place 50 mg onto the skin daily. 75 g 5   No current facility-administered medications for this visit.     Allergies as of 05/06/2017  . (No Known Allergies)    Vitals: BP 124/85   Pulse 69   Ht 5\' 8"  (1.727 m)   Wt 216 lb (98 kg)   BMI 32.84 kg/m  Last Weight:  Wt Readings from Last 1 Encounters:  05/06/17 216 lb (98 kg)       Last Height:   Ht Readings from Last 1 Encounters:  05/06/17 5\' 8"  (1.727 m)    Physical exam:  General: The patient is awake, alert and appears not in acute distress. The patient is well groomed. Head: Normocephalic, atraumatic. Neck is supple. Mallampati 3 -4,  neck circumference: 18.5 . Nasal airflow not restricted , TMJ is not  evident . Retrognathia is not seen.  Cardiovascular:  Regular rate and rhythm, without  murmurs or carotid bruit, and without distended  neck veins. Respiratory: Lungs are clear to auscultation. Skin:  Without evidence of edema, or rash Trunk: BMI is elevated and patient  has normal posture.  Neurologic exam :The patient is awake and alert, oriented to place and time.   Memory subjective described as intact. There is a normal attention span & concentration ability.  Speech is fluent - Mood and affect are appropriate. Cranial nerves: no change in taste or smell. Pupils are equal and briskly reactive to light without nystagmus. Visual fields by finger perimetry are intact. Hearing to finger rub intact.. Facial motor strength is symmetric and tongue and uvula move midline. Normal muscle tone, bulk and strength.    Assessment:  After physical and neurologic examination, review of laboratory studies, imaging, neurophysiology testing and pre-existing records, assessment is  Patient was on CPAP 8 cm, baseline study wasnot available, likely 14 years ago, [facility].   3 titrated to an October 2015 and has used an auto titrated.  Since his AHI is slightly higher than I like and not associated with a high air leak I will open the upper pressure window for his auto titration from between 4 / 8 to 4 /10 cm .   The patient was advised of the nature of the diagnosed sleep disorder, the treatment options and risks for general a health and wellness arising from not treating the condition. Visit duration was 25 minutes.   Plan:  Treatment plan and additional workup : [facility]. He will need a new mask headgear filter tubing etc. He will purchase a travel PAP at 12 cm water.   I would like  to reset the auto titration from 6-12 cm water pressure. RV in 12 month.  Eric Johns Jamie Belger MD  05/06/2017

## 2017-05-31 ENCOUNTER — Other Ambulatory Visit: Payer: Self-pay | Admitting: Family Medicine

## 2017-06-09 ENCOUNTER — Other Ambulatory Visit: Payer: Self-pay | Admitting: Family Medicine

## 2017-06-10 ENCOUNTER — Telehealth: Payer: Self-pay | Admitting: Family Medicine

## 2017-06-10 NOTE — Telephone Encounter (Signed)
Pt is going back out of the country and is having [facility] to call to get a refill on his doxycycline -he will be out of the country for two weeks   Best number [phone]

## 2017-06-10 NOTE — Telephone Encounter (Signed)
See message below  Please advise

## 2017-06-11 MED ORDER — DOXYCYCLINE HYCLATE 100 MG PO CAPS: 100.0000 mg | ORAL_CAPSULE | Freq: Every day | ORAL | 0 refills | Status: DC

## 2017-06-12 ENCOUNTER — Telehealth: Payer: Self-pay | Admitting: Neurology

## 2017-06-12 NOTE — Telephone Encounter (Signed)
Pt was notified by [facility] he needs new RX for CPAP supplies. Please fax and let the pt know when it has been faxed

## 2017-06-16 NOTE — Telephone Encounter (Signed)
I have sent another message to [facility] in regards to this as this order was sent on 05/06/2017. I will wait and see what is said from her.

## 2017-08-10 ENCOUNTER — Other Ambulatory Visit: Payer: Self-pay | Admitting: Family Medicine

## 2017-08-11 ENCOUNTER — Other Ambulatory Visit: Payer: Self-pay | Admitting: Family Medicine

## 2017-08-11 MED ORDER — TESTOSTERONE 20.25 MG/ACT (1.62%) TD GEL: 50.0000 mg | Freq: Every day | TRANSDERMAL | 5 refills | Status: DC

## 2017-08-11 NOTE — Telephone Encounter (Signed)
What medication

## 2017-08-11 NOTE — Telephone Encounter (Signed)
Prescription refill request.

## 2017-08-12 ENCOUNTER — Telehealth: Payer: Self-pay | Admitting: Family Medicine

## 2017-08-12 NOTE — Telephone Encounter (Signed)
Contacted pt regarding refill of androgel; pt states that he got a hard copy of the prescription from MDs office yesterday and took it to [facility] on [facility], but when his wife to pick up the prescription it was not there; pt also stated that this would be a generic and would be cheaper; [place] [place], Doctor, hospital (pharmacist) and pt explained to pt that prescription had to be shipped because it had not met the cutoff for next day delivery; Richardson Landry also offered the pt the option of using [facility] that [facility] in [facility]; pt says he is ok to wait for the prescription to go to [facility] store; Richardson Landry also had questions about dose of medication (pt previously getting 2 pumps but with new prescription says 50 grams which is not an even dose per Remo Lipps.Marland KitchenHow will he increase the dose.); will route to Dr Steffanie Dunn [place] pool

## 2017-08-12 NOTE — Telephone Encounter (Signed)
See telephone encounter dated 08/12/17; spoke with Jonny RuizJohn

## 2017-08-12 NOTE — Telephone Encounter (Signed)
Please advise about dosage

## 2017-08-13 NOTE — Telephone Encounter (Signed)
Pharmacist is calling to let provider know that the medication ordered has come in- dosing for pump is 20.25mg  per pump. Normal dosing is 2 pumps daily giving 40.5mg . ( next dosing is 60.75mg ) Ok'd the start dosing so patient could pick up Rx.

## 2017-08-15 MED ORDER — TESTOSTERONE 20.25 MG/ACT (1.62%) TD GEL: 40.0000 mg | Freq: Every day | TRANSDERMAL | 5 refills | Status: DC

## 2017-08-15 NOTE — Telephone Encounter (Signed)
Changed rx for 40mg  daily; sent [facility] to [facility].  Generic requested.

## 2017-08-15 NOTE — Addendum Note (Signed)
Addended by: Ethelda ChickSMITH, Falicia Lizotte M on: 08/15/2017 03:09 PM   Modules accepted: Orders

## 2018-02-12 ENCOUNTER — Other Ambulatory Visit: Payer: Self-pay | Admitting: Family Medicine

## 2018-02-13 MED ORDER — DOXYCYCLINE HYCLATE 100 MG PO CAPS: 100.0000 mg | ORAL_CAPSULE | Freq: Every day | ORAL | 0 refills | Status: DC

## 2018-02-13 MED ORDER — DOXYCYCLINE HYCLATE 100 MG PO CAPS: 100.0000 mg | ORAL_CAPSULE | Freq: Every day | ORAL | 1 refills | Status: DC

## 2018-02-13 NOTE — Telephone Encounter (Signed)
Pt would like to know when it is called in as he needs to start it as soon as possible.

## 2018-02-13 NOTE — Telephone Encounter (Signed)
Pt is going to [place] on Sunday he is needing doxycycline (VIBRAMYCIN) 100 MG capsule called in as soon as possible!

## 2018-02-13 NOTE — Telephone Encounter (Signed)
Malaria prophylaxis

## 2018-02-17 ENCOUNTER — Encounter: Payer: Self-pay | Admitting: Family Medicine

## 2018-03-06 ENCOUNTER — Encounter: Payer: Self-pay | Admitting: Physician Assistant

## 2018-03-06 ENCOUNTER — Ambulatory Visit (INDEPENDENT_AMBULATORY_CARE_PROVIDER_SITE_OTHER): Payer: BC Managed Care – PPO | Admitting: Physician Assistant

## 2018-03-06 ENCOUNTER — Other Ambulatory Visit: Payer: Self-pay

## 2018-03-06 VITALS — BP 92/70 | HR 74 | Temp 97.9°F | Resp 18 | Ht 68.0 in | Wt 219.4 lb

## 2018-03-06 DIAGNOSIS — Z1159 Encounter for screening for other viral diseases: Secondary | ICD-10-CM

## 2018-03-06 DIAGNOSIS — Z1329 Encounter for screening for other suspected endocrine disorder: Secondary | ICD-10-CM

## 2018-03-06 DIAGNOSIS — M109 Gout, unspecified: Secondary | ICD-10-CM

## 2018-03-06 DIAGNOSIS — Z1389 Encounter for screening for other disorder: Secondary | ICD-10-CM

## 2018-03-06 DIAGNOSIS — K760 Fatty (change of) liver, not elsewhere classified: Secondary | ICD-10-CM

## 2018-03-06 DIAGNOSIS — Z298 Encounter for other specified prophylactic measures: Secondary | ICD-10-CM

## 2018-03-06 DIAGNOSIS — G250 Essential tremor: Secondary | ICD-10-CM | POA: Diagnosis not present

## 2018-03-06 DIAGNOSIS — Z0000 Encounter for general adult medical examination without abnormal findings: Secondary | ICD-10-CM | POA: Diagnosis not present

## 2018-03-06 DIAGNOSIS — Z114 Encounter for screening for human immunodeficiency virus [HIV]: Secondary | ICD-10-CM

## 2018-03-06 DIAGNOSIS — M25551 Pain in right hip: Secondary | ICD-10-CM | POA: Diagnosis not present

## 2018-03-06 DIAGNOSIS — Z13220 Encounter for screening for lipoid disorders: Secondary | ICD-10-CM

## 2018-03-06 DIAGNOSIS — W57XXXA Bitten or stung by nonvenomous insect and other nonvenomous arthropods, initial encounter: Secondary | ICD-10-CM

## 2018-03-06 DIAGNOSIS — E291 Testicular hypofunction: Secondary | ICD-10-CM

## 2018-03-06 LAB — POCT URINALYSIS DIP (MANUAL ENTRY)
BILIRUBIN UA: NEGATIVE
Blood, UA: NEGATIVE
GLUCOSE UA: NEGATIVE mg/dL
Ketones, POC UA: NEGATIVE mg/dL
Leukocytes, UA: NEGATIVE
Nitrite, UA: NEGATIVE
Protein Ur, POC: NEGATIVE mg/dL
SPEC GRAV UA: 1.015 (ref 1.010–1.025)
Urobilinogen, UA: 0.2 E.U./dL
pH, UA: 6 (ref 5.0–8.0)

## 2018-03-06 MED ORDER — DICLOFENAC SODIUM 1 % TD GEL: 2.0000 g | Freq: Four times a day (QID) | TRANSDERMAL | 0 refills | Status: AC

## 2018-03-06 MED ORDER — TESTOSTERONE 20.25 MG/ACT (1.62%) TD GEL: 40.0000 mg | Freq: Every day | TRANSDERMAL | 5 refills | Status: DC

## 2018-03-06 NOTE — Progress Notes (Signed)
Eric Johns  MRN: [id number] DOB: March 21, 1956  Subjective:  Pt is is a 62 y.o. male who presents for annual physical exam. Pt is fasting today.   Exercise: No structured exercise Diet: Chicken, red meat, pasta, vegetables, fruits, and cheese. Drinks mostly water and sweet tea. Takes multivitamin.  Sleep: 7.5 hrs per night.[place] and will occasionally have jet lag.  BM: BID Urinary issues: +weak stream, urinary frequency, nocturia (once per night). No FH of prostate cancer  Sexually active: Has not had sex in 3 years. Used to use viagra but has not had to in 3 years.   Last dental exam: 3 mths ago, brushes BID Last vision exam: about one year ago, due for one now.   Last colonoscopy: within past years in [place].  Vaccinations      Tetanus: Within past 10 years      Shingrix: Never   Concerns: 1) Bilateral hand tremors x 1-2 years. It has been getting gradually worse over the last 2 years. Worse with activity. Not present at rest. His father had the same thing.   2) Doxycycline: Currently taking doxy for malaria prophylaxis. Just returned from [place]. He is going to either [place] or [place] in in 2 weeks.   3) Concern for memory loss: His wife is concerned that he is not remembering things like he used to.He still teaches and travels for work. He considers himself high functional for his age. Thinks it may be stimulus overload. Will sometimes forget where his keys are but that is the extent. Has FH of alzheimer's in uncle.   4) Something on his back, he cant see it. Noticed it yesterday. It itches. Denies pain.   5) Chronic pain in right hip. Thinks it is from moving his mother in law. He uses some of his wife's voltaran gel occasionally and it really helps. No acute trauma. Denies numbness and tingling.    Chronic issues: 1)OSA: Sees Dr. Vickey Huger.  Wears CPAP nightly. 2)Hypogonadism: Applies AndroGel daily.  Used to receive testosterone injections  but discontinued due to elevated hgb, which resolved when injections were d/c.  Started gel in 12/2016, testosterone level was 311.Tolerating well.  3)Gout: onset 2004.  Watches what he eats and drinks. Last gout attack ~8 mths ago.  Taking allopurinol 100mg  BID.  4) Fatty liver: chronic; suffered chemical hepatitis overseas. 5) Arthritis     Patient Active Problem List   Diagnosis Date Noted  . Circadian rhythm sleep disorder, jet lag type 05/06/2017  . Hypogonadism in male 11/29/2016  . Class 1 obesity due to excess calories without serious comorbidity with body mass index (BMI) of 34.0 to 34.9 in adult 11/29/2016  . Fatty liver disease, nonalcoholic 11/09/2016  . Chronic idiopathic gout involving toe of right foot without tophus 11/09/2016  . Abnormal LFTs 08/08/2015  . Eosinophil count raised 05/09/2015  . OSA on CPAP 06/13/2014    Current Outpatient Medications on File Prior to Visit  Medication Sig Dispense Refill  . allopurinol (ZYLOPRIM) 100 MG tablet TAKE 1 TABLET (100 MG TOTAL) BY MOUTH 2 (TWO) TIMES DAILY. 180 tablet 3  . aspirin 81 MG tablet Take 81 mg by mouth daily.    Marland Kitchen azithromycin (ZITHROMAX) 500 MG tablet Take 1 tablet (500 mg total) by mouth daily. 5 tablet 1  . ciprofloxacin (CIPRO) 500 MG tablet Take 1 tablet (500 mg total) by mouth 2 (two) times daily. 20 tablet 0  . colchicine 0.6 MG  tablet Take 1 tablet (0.6 mg total) by mouth 2 (two) times daily as needed. 60 tablet 3  . doxycycline (VIBRAMYCIN) 100 MG capsule Take 1 capsule (100 mg total) by mouth daily. Start 48 hours prior to departure; continue for four weeks after return 60 capsule 1  . fluticasone (FLONASE) 50 MCG/ACT nasal spray Place into both nostrils daily.    . Melatonin 3 MG TABS Take by mouth.    . Multiple Vitamins-Minerals (MULTIVITAMIN PO) Take by mouth. Taking daily ( [employer] pack)    . Testosterone (ANDROGEL PUMP) 20.25 MG/ACT (1.62%) GEL Place 40 mg onto the skin daily. GENERIC PLEASE 75 g 5    No current facility-administered medications on file prior to visit.     No Known Allergies  Social History   Socioeconomic History  . Marital status: Married    Spouse name: Marylene Landngela  . Number of children: 3  . Years of education: College  . Highest education level: Not on file  Occupational History  . Occupation: employed  Engineer, productionocial Needs  . Financial resource strain: Not on file  . Food insecurity:    Worry: Not on file    Inability: Not on file  . Transportation needs:    Medical: Not on file    Non-medical: Not on file  Tobacco Use  . Smoking status: Never Smoker  . Smokeless tobacco: Former NeurosurgeonUser    Types: Snuff  Substance and Sexual Activity  . Alcohol use: Yes    Alcohol/week: 0.6 - 1.2 oz    Types: 1 - 2 Glasses of wine per week  . Drug use: No  . Sexual activity: Not Currently  Lifestyle  . Physical activity:    Days per week: Not on file    Minutes per session: Not on file  . Stress: Not on file  Relationships  . Social connections:    Talks on phone: Not on file    Gets together: Not on file    Attends religious service: Not on file    Active member of club or organization: Not on file    Attends meetings of clubs or organizations: Not on file    Relationship status: Not on file  Other Topics Concern  . Not on file  Social History Narrative   Patient is married [name](Angela) and lives at home with his wife and step-son.   Patient is retired and working part-time - Research scientist (medical)Consultant work.   Patient is currently in college for his Master's degree.   Patient is right-handed.   Patient has three children and his wife has two children.   Patient drinks one cup of coffee daily.    Past Surgical History:  Procedure Laterality Date  . APPENDECTOMY  1972    Family History  Problem Relation Age of Onset  . Diverticulitis Father   . Diabetes Maternal Grandfather   . Emphysema Maternal Grandfather     Review of Systems  Constitutional: Negative for activity  change, appetite change, chills, diaphoresis, fatigue, fever and unexpected weight change.  HENT: Negative for congestion, dental problem, drooling, ear discharge, ear pain, facial swelling, hearing loss, mouth sores, nosebleeds, postnasal drip, rhinorrhea, sinus pressure, sinus pain, sneezing, sore throat, tinnitus, trouble swallowing and voice change.   Eyes: Negative for photophobia, pain, discharge, redness, itching and visual disturbance.  Respiratory: Negative for apnea, cough, choking, chest tightness, shortness of breath, wheezing and stridor.   Cardiovascular: Negative for chest pain, palpitations and leg swelling.  Gastrointestinal: Negative for abdominal distention,  abdominal pain, anal bleeding, blood in stool, constipation, diarrhea, nausea, rectal pain and vomiting.  Endocrine: Negative for cold intolerance, heat intolerance, polydipsia, polyphagia and polyuria.  Genitourinary: Negative for decreased urine volume, difficulty urinating, discharge, dysuria, enuresis, flank pain, frequency, genital sores, hematuria, penile pain, penile swelling, scrotal swelling, testicular pain and urgency.  Musculoskeletal: Negative for arthralgias, back pain, gait problem, joint swelling, myalgias, neck pain and neck stiffness.  Skin: Negative for color change, pallor, rash and wound.  Allergic/Immunologic: Negative for environmental allergies, food allergies and immunocompromised state.  Neurological: Negative for dizziness, tremors, seizures, syncope, facial asymmetry, speech difficulty, weakness, light-headedness, numbness and headaches.  Hematological: Negative for adenopathy. Does not bruise/bleed easily.  Psychiatric/Behavioral: Negative for agitation, behavioral problems, confusion, decreased concentration, dysphoric mood, hallucinations, self-injury, sleep disturbance and suicidal ideas. The patient is not nervous/anxious and is not hyperactive.     Objective:  Pulse 74   Temp 97.9 F (36.6 C)  (Oral)   Resp 18   Ht 5\' 8"  (1.727 m)   Wt 219 lb 6.4 oz (99.5 kg)   SpO2 94%   BMI 33.36 kg/m   Physical Exam  Constitutional: He is oriented to person, place, and time. He appears well-developed and well-nourished. No distress.  HENT:  Head: Normocephalic and atraumatic.  Right Ear: Hearing, tympanic membrane, external ear and ear canal normal.  Left Ear: Hearing, tympanic membrane, external ear and ear canal normal.  Nose: Nose normal.  Mouth/Throat: Uvula is midline, oropharynx is clear and moist and mucous membranes are normal. No oropharyngeal exudate.  Eyes: Pupils are equal, round, and reactive to light. Conjunctivae and EOM are normal.  Neck: Trachea normal and normal range of motion.  Cardiovascular: Normal rate, regular rhythm, normal heart sounds and intact distal pulses.  Pulmonary/Chest: Effort normal and breath sounds normal.  Abdominal: Soft. Normal appearance and bowel sounds are normal.  Genitourinary: Prostate normal. Prostate is not enlarged and not tender.  Genitourinary Comments: Chaperone present for exam.  Musculoskeletal: Normal range of motion.       Right hip: He exhibits decreased strength (hip flexion 4/5, hip extension 5/5, hip abduction/adduction 5/5) and tenderness (TTP of lateral hip and gluteal musculature). He exhibits normal range of motion, no bony tenderness, no swelling, no crepitus and no laceration.       Left hip: Normal.  Lymphadenopathy:       Head (right side): No submental, no submandibular, no tonsillar, no preauricular, no posterior auricular and no occipital adenopathy present.       Head (left side): No submental, no submandibular, no tonsillar, no preauricular, no posterior auricular and no occipital adenopathy present.    He has no cervical adenopathy.       Right: No supraclavicular adenopathy present.       Left: No supraclavicular adenopathy present.  Neurological: He is alert and oriented to person, place, and time. He has normal  reflexes. No sensory deficit.  Bilateral hand tremor activated by voluntary movement, no hand tremor noted at rest.  No bradykinesia or rigidity.  Skin: Skin is warm and dry.     Vitals reviewed.  No exam data present   Wt Readings from Last 3 Encounters:  03/06/18 219 lb 6.4 oz (99.5 kg)  05/06/17 216 lb (98 kg)  03/21/17 215 lb (97.5 kg)    MMSE - Mini Mental State Exam 03/06/2018 03/06/2018  Orientation to time 5 5  Orientation to Place 5 5  Registration 3 3  Attention/ Calculation 5 5  Recall 3 3  Language- name 2 objects 2 2  Language- repeat 1 1  Language- follow 3 step command 3 3  Language- read & follow direction 1 1  Write a sentence 1 1  Copy design 1 1  Total score 30 30     Procedure:Tick Removal Verbal consent obtained from patient.  Area cleansed with alcohol pad.  Tick removed with forceps, head was intact.  Tick was not engorged. Cleansed with alcohol pad, bandage applied.  Patient tolerated well.  Assessment and Plan :  Discussed healthy lifestyle, diet, exercise, preventative care, vaccinations, and addressed patient's concerns. Plan for follow up in 6 months. Otherwise, plan for specific conditions below.  1. Annual physical exam Await lab results. MMSE score of 30, pt reassured.   2. Gout, unspecified cause, unspecified chronicity, unspecified site - Uric Acid  3. Hypogonadism in male - CBC with Differential/Platelet - Testosterone - Testosterone (ANDROGEL PUMP) 20.25 MG/ACT (1.62%) GEL; Place 40 mg onto the skin daily. GENERIC PLEASE  Dispense: 75 g; Refill: 5  4. Need for malaria prophylaxis Pt may need extended doxy regimen if going to [place] in 2 weeks. Will contact our office and notify me if he needs refill.   5. Essential tremor Hx and PE findings consistent with essential tremor. Pt declines any tx or further evaluation at this time as it is only mildly affecting him at this time. Will return if it worsens or he wants further evaluation.    6. Tick bite, initial encounter Tick successfully removed. He was out in the yard yesterday so it likely has been on him for one day. It was not engorged when it was removed. He is on doxy for malaria prophylaxis, which is reassuring. He will continue as prescribed.   7. Fatty liver disease, nonalcoholic -CMP  8. Pain of right hip joint DDx includes OA vs bursitis. Pt has been using voltaren gel prn with relief. Rx provided.  - diclofenac sodium (VOLTAREN) 1 % GEL; Apply 2 g topically 4 (four) times daily.  Dispense: 100 g; Refill: 0  9. Screening for hematuria or proteinuria - POCT urinalysis dipstick  10. Screening for HIV (human immunodeficiency virus) - HIV antibody  11. Need for hepatitis C screening test - Hepatitis C antibody  12. Screening for thyroid disorder - TSH  13. Screening cholesterol level - Lipid panel    Benjiman Core, PA-C  [facility] 03/06/2018 10:43 AM

## 2018-03-06 NOTE — Patient Instructions (Addendum)
I have given you refills for your testosterone.  Please follow-up in about 6 months.  Side effects of chronic testosterone use can include headache, increased blood count, increased PSA, high blood pressure, sleep apnea, increased risk for stroke and heart attack.    Your Mini-Mental status exam was completely normal.  This is reassuring.  In terms of your tremor, I have given you some information below about essential tremor.  If it becomes debilitating and you would like treatment, please return for further evaluation.  I have given you a prescription for Voltaren gel.  Please use as needed.  We have collected labs today and should have your results back within 1 to 2 weeks.   Health Maintenance, Male A healthy lifestyle and preventive care is important for your health and wellness. Ask your health care provider about what schedule of regular examinations is right for you. What should I know about weight and diet? Eat a Healthy Diet  Eat plenty of vegetables, fruits, whole grains, low-fat dairy products, and lean protein.  Do not eat a lot of foods high in solid fats, added sugars, or salt.  Maintain a Healthy Weight Regular exercise can help you achieve or maintain a healthy weight. You should:  Do at least 150 minutes of exercise each week. The exercise should increase your heart rate and make you sweat (moderate-intensity exercise).  Do strength-training exercises at least twice a week.  Watch Your Levels of Cholesterol and Blood Lipids  Have your blood tested for lipids and cholesterol every 5 years starting at 63 years of age. If you are at high risk for heart disease, you should start having your blood tested when you are 63 years old. You may need to have your cholesterol levels checked more often if: ? Your lipid or cholesterol levels are high. ? You are older than 63 years of age. ? You are at high risk for heart disease.  What should I know about cancer screening? Many types of  cancers can be detected early and may often be prevented. Lung Cancer  You should be screened every year for lung cancer if: ? You are a current smoker who has smoked for at least 30 years. ? You are a former smoker who has quit within the past 15 years.  Talk to your health care provider about your screening options, when you should start screening, and how often you should be screened.  Colorectal Cancer  Routine colorectal cancer screening usually begins at 63 years of age and should be repeated every 5-10 years until you are 63 years old. You may need to be screened more often if early forms of precancerous polyps or small growths are found. Your health care provider may recommend screening at an earlier age if you have risk factors for colon cancer.  Your health care provider may recommend using home test kits to check for hidden blood in the stool.  A small camera at the end of a tube can be used to examine your colon (sigmoidoscopy or colonoscopy). This checks for the earliest forms of colorectal cancer.  Prostate and Testicular Cancer  Depending on your age and overall health, your health care provider may do certain tests to screen for prostate and testicular cancer.  Talk to your health care provider about any symptoms or concerns you have about testicular or prostate cancer.  Skin Cancer  Check your skin from head to toe regularly.  Tell your health care provider about any new moles  or changes in moles, especially if: ? There is a change in a mole's size, shape, or color. ? You have a mole that is larger than a pencil eraser.  Always use sunscreen. Apply sunscreen liberally and repeat throughout the day.  Protect yourself by wearing long sleeves, pants, a wide-brimmed hat, and sunglasses when outside.  What should I know about heart disease, diabetes, and high blood pressure?  If you are 36-81 years of age, have your blood pressure checked every 3-5 years. If you are  43 years of age or older, have your blood pressure checked every year. You should have your blood pressure measured twice-once when you are at a hospital or clinic, and once when you are not at a hospital or clinic. Record the average of the two measurements. To check your blood pressure when you are not at a hospital or clinic, you can use: ? An automated blood pressure machine at a pharmacy. ? A home blood pressure monitor.  Talk to your health care provider about your target blood pressure.  If you are between 78-51 years old, ask your health care provider if you should take aspirin to prevent heart disease.  Have regular diabetes screenings by checking your fasting blood sugar level. ? If you are at a normal weight and have a low risk for diabetes, have this test once every three years after the age of 6. ? If you are overweight and have a high risk for diabetes, consider being tested at a younger age or more often.  A one-time screening for abdominal aortic aneurysm (AAA) by ultrasound is recommended for men aged 65-75 years who are current or former smokers. What should I know about preventing infection? Hepatitis B If you have a higher risk for hepatitis B, you should be screened for this virus. Talk with your health care provider to find out if you are at risk for hepatitis B infection. Hepatitis C Blood testing is recommended for:  Everyone born from [birth year] through 1966.  Anyone with known risk factors for hepatitis C.  Sexually Transmitted Diseases (STDs)  You should be screened each year for STDs including gonorrhea and chlamydia if: ? You are sexually active and are younger than 63 years of age. ? You are older than 62 years of age and your health care provider tells you that you are at risk for this type of infection. ? Your sexual activity has changed since you were last screened and you are at an increased risk for chlamydia or gonorrhea. Ask your health care provider if you  are at risk.  Talk with your health care provider about whether you are at high risk of being infected with HIV. Your health care provider may recommend a prescription medicine to help prevent HIV infection.  What else can I do?  Schedule regular health, dental, and eye exams.  Stay current with your vaccines (immunizations).  Do not use any tobacco products, such as cigarettes, chewing tobacco, and e-cigarettes. If you need help quitting, ask your health care provider.  Limit alcohol intake to no more than 2 drinks per day. One drink equals 12 ounces of beer, 5 ounces of wine, or 1 ounces of hard liquor.  Do not use street drugs.  Do not share needles.  Ask your health care provider for help if you need support or information about quitting drugs.  Tell your health care provider if you often feel depressed.  Tell your health care provider if you have  ever been abused or do not feel safe at home. This information is not intended to replace advice given to you by your health care provider. Make sure you discuss any questions you have with your health care provider. Document Released: 03/07/2008 Document Revised: 05/08/2016 Document Reviewed: 06/13/2015 Elsevier Interactive Patient Education  2018 [employer].   Essential Tremor A tremor is trembling or shaking that you cannot control. Most tremors affect the hands or arms. Tremors can also affect the head, vocal cords, face, and other parts of the body. Essential tremor is a tremor without a known cause. What are the causes? Essential tremor has no known cause. What increases the risk? You may be at greater risk of essential tremor if:  You have a family member with essential tremor.  You are age 48 or older.  You take certain medicines.  What are the signs or symptoms? The main sign of a tremor is uncontrolled and unintentional rhythmic shaking of a body part.  You may have difficulty eating with a spoon or  fork.  You may have difficulty writing.  You may nod your head up and down or side to side.  You may have a quivering voice.  Your tremors:  May get worse over time.  May come and go.  May be more noticeable on one side of your body.  May get worse due to stress, fatigue, caffeine, and extreme heat or cold.  How is this diagnosed? Your health care provider can diagnose essential tremor based on your symptoms, medical history, and a physical examination. There is no single test to diagnose an essential tremor. However, your health care provider may perform a variety of tests to rule out other conditions. Tests may include:  Blood and urine tests.  Imaging studies of your brain, such as: ? CT scan. ? MRI.  A test that measures involuntary muscle movement (electromyogram).  How is this treated? Your tremors may go away without treatment. Mild tremors may not need treatment if they do not affect your day-to-day life. Severe tremors may need to be treated using one or a combination of the following options:  Medicines. This may include medicine that is injected.  Lifestyle changes.  Physical therapy.  Follow these instructions at home:  Take medicines only as directed by your health care provider.  Limit alcohol intake to no more than 1 drink per day for nonpregnant women and 2 drinks per day for men. One drink equals 12 oz of beer, 5 oz of wine, or 1 oz of hard liquor.  Do not use any tobacco products, including cigarettes, chewing tobacco, or electronic cigarettes. If you need help quitting, ask your health care provider.  Take medicines only as directed by your health care provider.  Avoid extreme heat or cold.  Limit the amount of caffeine you consumeas directed by your health care provider.  Try to get eight hours of sleep each night.  Find ways to manage your stress, such as meditation or yoga.  Keep all follow-up visits as directed by your health care  provider. This is important. This includes any physical therapy visits. Contact a health care provider if:  You experience any changes in the location or intensity of your tremors.  You start having a tremor after starting a new medicine.  You have tremor with other symptoms such as: ? Numbness. ? Tingling. ? Pain. ? Weakness.  Your tremor gets worse.  Your tremor interferes with your daily life. This information is  not intended to replace advice given to you by your health care provider. Make sure you discuss any questions you have with your health care provider. Document Released: 09/30/2014 Document Revised: 02/15/2016 Document Reviewed: 03/07/2014 Elsevier Interactive Patient Education  2018 [employer].  Essential Tremor A tremor is trembling or shaking that you cannot control. Most tremors affect the hands or arms. Tremors can also affect the head, vocal cords, face, and other parts of the body. Essential tremor is a tremor without a known cause. What are the causes? Essential tremor has no known cause. What increases the risk? You may be at greater risk of essential tremor if:  You have a family member with essential tremor.  You are age 57 or older.  You take certain medicines.  What are the signs or symptoms? The main sign of a tremor is uncontrolled and unintentional rhythmic shaking of a body part.  You may have difficulty eating with a spoon or fork.  You may have difficulty writing.  You may nod your head up and down or side to side.  You may have a quivering voice.  Your tremors:  May get worse over time.  May come and go.  May be more noticeable on one side of your body.  May get worse due to stress, fatigue, caffeine, and extreme heat or cold.  How is this diagnosed? Your health care provider can diagnose essential tremor based on your symptoms, medical history, and a physical examination. There is no single test to diagnose an essential  tremor. However, your health care provider may perform a variety of tests to rule out other conditions. Tests may include:  Blood and urine tests.  Imaging studies of your brain, such as: ? CT scan. ? MRI.  A test that measures involuntary muscle movement (electromyogram).  How is this treated? Your tremors may go away without treatment. Mild tremors may not need treatment if they do not affect your day-to-day life. Severe tremors may need to be treated using one or a combination of the following options:  Medicines. This may include medicine that is injected.  Lifestyle changes.  Physical therapy.  Follow these instructions at home:  Take medicines only as directed by your health care provider.  Limit alcohol intake to no more than 1 drink per day for nonpregnant women and 2 drinks per day for men. One drink equals 12 oz of beer, 5 oz of wine, or 1 oz of hard liquor.  Do not use any tobacco products, including cigarettes, chewing tobacco, or electronic cigarettes. If you need help quitting, ask your health care provider.  Take medicines only as directed by your health care provider.  Avoid extreme heat or cold.  Limit the amount of caffeine you consumeas directed by your health care provider.  Try to get eight hours of sleep each night.  Find ways to manage your stress, such as meditation or yoga.  Keep all follow-up visits as directed by your health care provider. This is important. This includes any physical therapy visits. Contact a health care provider if:  You experience any changes in the location or intensity of your tremors.  You start having a tremor after starting a new medicine.  You have tremor with other symptoms such as: ? Numbness. ? Tingling. ? Pain. ? Weakness.  Your tremor gets worse.  Your tremor interferes with your daily life. This information is not intended to replace advice given to you by your health care provider. Make sure  you discuss  any questions you have with your health care provider. Document Released: 09/30/2014 Document Revised: 02/15/2016 Document Reviewed: 03/07/2014 Elsevier Interactive Patient Education  2018 [employer]  IF you received an x-ray today, you will receive an invoice from [facility]. Please contact [facility] at [phone] with questions or concerns regarding your invoice.   IF you received labwork today, you will receive an invoice from [facility]. Please contact [facility] at [phone] with questions or concerns regarding your invoice.   Our billing staff will not be able to assist you with questions regarding bills from these companies.  You will be contacted with the lab results as soon as they are available. The fastest way to get your results is to activate your My Chart account. Instructions are located on the last page of this paperwork. If you have not heard from us regarding the results in 2 weeks, please contact this office.

## 2018-03-07 LAB — CBC WITH DIFFERENTIAL/PLATELET
BASOS ABS: 0 10*3/uL (ref 0.0–0.2)
Basos: 0 %
EOS (ABSOLUTE): 0.4 10*3/uL (ref 0.0–0.4)
Eos: 5 %
HEMOGLOBIN: 18.7 g/dL — AB (ref 13.0–17.7)
Hematocrit: 53.8 % — ABNORMAL HIGH (ref 37.5–51.0)
IMMATURE GRANS (ABS): 0 10*3/uL (ref 0.0–0.1)
Immature Granulocytes: 0 %
LYMPHS ABS: 2.4 10*3/uL (ref 0.7–3.1)
LYMPHS: 33 %
MCH: 32.7 pg (ref 26.6–33.0)
MCHC: 34.8 g/dL (ref 31.5–35.7)
MCV: 94 fL (ref 79–97)
MONOCYTES: 6 %
Monocytes Absolute: 0.4 10*3/uL (ref 0.1–0.9)
Neutrophils Absolute: 4 10*3/uL (ref 1.4–7.0)
Neutrophils: 56 %
PLATELETS: 235 10*3/uL (ref 150–450)
RBC: 5.72 x10E6/uL (ref 4.14–5.80)
RDW: 14.6 % (ref 12.3–15.4)
WBC: 7.3 10*3/uL (ref 3.4–10.8)

## 2018-03-07 LAB — COMPREHENSIVE METABOLIC PANEL
ALBUMIN: 5.1 g/dL — AB (ref 3.6–4.8)
ALK PHOS: 97 IU/L (ref 39–117)
ALT: 60 IU/L — AB (ref 0–44)
AST: 52 IU/L — ABNORMAL HIGH (ref 0–40)
Albumin/Globulin Ratio: 1.9 (ref 1.2–2.2)
BILIRUBIN TOTAL: 0.5 mg/dL (ref 0.0–1.2)
BUN / CREAT RATIO: 13 (ref 10–24)
BUN: 17 mg/dL (ref 8–27)
CHLORIDE: 103 mmol/L (ref 96–106)
CO2: 21 mmol/L (ref 20–29)
CREATININE: 1.33 mg/dL — AB (ref 0.76–1.27)
Calcium: 10.2 mg/dL (ref 8.6–10.2)
GFR calc Af Amer: 66 mL/min/{1.73_m2} (ref >59)
GFR calc non Af Amer: 57 mL/min/{1.73_m2} — ABNORMAL LOW (ref >59)
Globulin, Total: 2.7 g/dL (ref 1.5–4.5)
Glucose: 94 mg/dL (ref 65–99)
Potassium: 4.7 mmol/L (ref 3.5–5.2)
Sodium: 139 mmol/L (ref 134–144)
Total Protein: 7.8 g/dL (ref 6.0–8.5)

## 2018-03-07 LAB — URIC ACID: Uric Acid: 6.8 mg/dL (ref 3.7–8.6)

## 2018-03-07 LAB — TESTOSTERONE: Testosterone: 369 ng/dL (ref 264–916)

## 2018-03-07 LAB — LIPID PANEL
CHOLESTEROL TOTAL: 252 mg/dL — AB (ref 100–199)
Chol/HDL Ratio: 4.1 ratio (ref 0.0–5.0)
HDL: 61 mg/dL (ref >39)
LDL CALC: 145 mg/dL — AB (ref 0–99)
TRIGLYCERIDES: 232 mg/dL — AB (ref 0–149)
VLDL CHOLESTEROL CAL: 46 mg/dL — AB (ref 5–40)

## 2018-03-07 LAB — TSH: TSH: 3.74 u[IU]/mL (ref 0.450–4.500)

## 2018-03-07 LAB — HEPATITIS C ANTIBODY: HEP C VIRUS AB: 0.4 {s_co_ratio} (ref 0.0–0.9)

## 2018-03-07 LAB — HIV ANTIBODY (ROUTINE TESTING W REFLEX): HIV SCREEN 4TH GENERATION: NONREACTIVE

## 2018-03-22 ENCOUNTER — Encounter: Payer: Self-pay | Admitting: Physician Assistant

## 2018-03-24 NOTE — Progress Notes (Signed)
I called pt and left v.m

## 2018-03-24 NOTE — Progress Notes (Signed)
I called and pt and left vm

## 2018-05-05 ENCOUNTER — Encounter: Payer: Self-pay | Admitting: Adult Health

## 2018-05-07 ENCOUNTER — Ambulatory Visit (INDEPENDENT_AMBULATORY_CARE_PROVIDER_SITE_OTHER): Payer: BC Managed Care – PPO | Admitting: Adult Health

## 2018-05-07 ENCOUNTER — Encounter: Payer: Self-pay | Admitting: Adult Health

## 2018-05-07 VITALS — BP 120/70 | HR 60 | Ht 68.0 in | Wt 224.0 lb

## 2018-05-07 DIAGNOSIS — Z9989 Dependence on other enabling machines and devices: Secondary | ICD-10-CM

## 2018-05-07 DIAGNOSIS — G4733 Obstructive sleep apnea (adult) (pediatric): Secondary | ICD-10-CM | POA: Diagnosis not present

## 2018-05-07 NOTE — Progress Notes (Signed)
PATIENT: Eric Johns DOB: 01-08-1956  REASON FOR VISIT: follow up HISTORY FROM: Eric Johns  HISTORY OF PRESENT ILLNESS: Today 05/07/18:  Mr. Eric Johns is a 62 year old male with a history of obstructive sleep apnea on CPAP.  He returns today for follow-up.  His CPAP download indicates that he use his machine nightly for compliance of 100%.  He uses machine greater than 4 hours each night.  On average he uses his machine 7 hours and 16 minutes.  His residual AHI is 4.4 on 6 to 12 cm of water with EPR 3.  He reports that the CPAP continues to work well for him.  He reports that he takes it everywhere he goes.  He denies any new neurological symptoms.  He returns today for evaluation.    HISTORY 05/06/2017, Mr. Eric Johns is a compliant CPAP user who has used a CPAP for the last 30 days it 100%, his AHI is 4.9, his using an AutoSet between 4 and 10 cm water with 3 cm expiratory pressure relief the pressure at the 95th percentile is 9.7 cm. There is room to increase the pressure a little bit more. I'm very happy with his compliance time an average of 6 hours and 16 minutes each night. He may need 12 cm water. He needs to use a travel friendly machine.  [place], he worked in [place] the month before. He will buy a travel CPAP outright.    REVIEW OF SYSTEMS: Out of a complete 14 system review of symptoms, the patient complains only of the following symptoms, and all other reviewed systems are negative.   See HPI, ESS4 FSS25  ALLERGIES: No Known Allergies  HOME MEDICATIONS: Outpatient Medications Prior to Visit  Medication Sig Dispense Refill  . allopurinol (ZYLOPRIM) 100 MG tablet TAKE 1 TABLET (100 MG TOTAL) BY MOUTH 2 (TWO) TIMES DAILY. 180 tablet 3  . aspirin 81 MG tablet Take 81 mg by mouth daily.    . colchicine 0.6 MG tablet Take 1 tablet (0.6 mg total) by mouth 2 (two) times daily as needed. 60 tablet 3  . diclofenac sodium (VOLTAREN) 1 % GEL Apply 2 g topically 4  (four) times daily. 100 g 0  . doxycycline (VIBRAMYCIN) 100 MG capsule Take 1 capsule (100 mg total) by mouth daily. Start 48 hours prior to departure; continue for four weeks after return 60 capsule 1  . Melatonin 3 MG TABS Take by mouth.    . Multiple Vitamins-Minerals (MULTIVITAMIN PO) Take by mouth. Taking daily ( [employer] pack)    . triamcinolone (NASACORT) 55 MCG/ACT AERO nasal inhaler Place into the nose.    . sildenafil (VIAGRA) 100 MG tablet Take by mouth.    . Testosterone (ANDROGEL PUMP) 20.25 MG/ACT (1.62%) GEL Place 40 mg onto the skin daily. GENERIC PLEASE 75 g 5   No facility-administered medications prior to visit.     PAST MEDICAL HISTORY: Past Medical History:  Diagnosis Date  . Gout   . Hypogonadism in male   . Mixed hyperlipidemia   . NAFL (nonalcoholic fatty liver)   . Obesity   . OSA on CPAP 06/13/2014    PAST SURGICAL HISTORY: Past Surgical History:  Procedure Laterality Date  . APPENDECTOMY  1972    FAMILY HISTORY: Family History  Problem Relation Age of Onset  . Diverticulitis Father   . Diabetes Maternal Grandfather   . Emphysema Maternal Grandfather     SOCIAL HISTORY: Social History   Socioeconomic  History  . Marital status: Married    Spouse name: Eric Johns  . Number of children: 3  . Years of education: College  . Highest education level: Not on file  Occupational History  . Occupation: Animal nutritionist    Comment: Transport planner [employer]  Social Needs  . Financial resource strain: Not hard at all  . Food insecurity:    Worry: Never true    Inability: Never true  . Transportation needs:    Medical: No    Non-medical: No  Tobacco Use  . Smoking status: Never Smoker  . Smokeless tobacco: Former Neurosurgeon    Types: Snuff  Substance and Sexual Activity  . Alcohol use: Yes    Alcohol/week: 1.0 - 2.0 standard drinks    Types: 1 - 2 Glasses of wine per week  . Drug use: No  . Sexual activity: Not Currently  Lifestyle  . Physical  activity:    Days per week: 0 days    Minutes per session: 0 min  . Stress: To some extent  Relationships  . Social connections:    Talks on phone: More than three times a week    Gets together: Never    Attends religious service: More than 4 times per year    Active member of club or organization: Yes    Attends meetings of clubs or organizations: More than 4 times per year    Relationship status: Married  . Intimate partner violence:    Fear of current or ex partner: No    Emotionally abused: No    Physically abused: No    Forced sexual activity: No  Other Topics Concern  . Not on file  Social History Narrative   Patient is married Eric Johns). He and his wife live at different locations.    Patient is retired and working part-time.    Patient is right-handed.   Patient has three children and his wife has two children.      PHYSICAL EXAM  Vitals:   05/07/18 1114  BP: 120/70  Pulse: 60  Weight: 224 lb (101.6 kg)  Height: 5\' 8"  (1.727 m)   Body mass index is 34.06 kg/m.  Generalized: Well developed, in no acute distress   Neurological examination  Mentation: Alert oriented to time, place, history taking. Follows all commands speech and language fluent Cranial nerve II-XII: Pupils were equal round reactive to light. Extraocular movements were full, visual field were full on confrontational test. Facial sensation and strength were normal. Uvula tongue midline. Head turning and shoulder shrug  were normal and symmetric.  Neck circumference 18 inches, Mallampati 4+ Motor: The motor testing reveals 5 over 5 strength of all 4 extremities. Good symmetric motor tone is noted throughout.  Sensory: Sensory testing is intact to soft touch on all 4 extremities. No evidence of extinction is noted.  Coordination: Cerebellar testing reveals good finger-nose-finger and heel-to-shin bilaterally.  Gait and station: Gait is normal.    DIAGNOSTIC DATA (LABS, IMAGING, TESTING) - I  reviewed patient records, labs, notes, testing and imaging myself where available.  Lab Results  Component Value Date   WBC 7.3 03/06/2018   HGB 18.7 (H) 03/06/2018   HCT 53.8 (H) 03/06/2018   MCV 94 03/06/2018   PLT 235 03/06/2018      Component Value Date/Time   NA 139 03/06/2018 1030   K 4.7 03/06/2018 1030   CL 103 03/06/2018 1030   CO2 21 03/06/2018 1030   GLUCOSE 94 03/06/2018 1030  BUN 17 03/06/2018 1030   CREATININE 1.33 (H) 03/06/2018 1030   CALCIUM 10.2 03/06/2018 1030   PROT 7.8 03/06/2018 1030   ALBUMIN 5.1 (H) 03/06/2018 1030   AST 52 (H) 03/06/2018 1030   ALT 60 (H) 03/06/2018 1030   ALKPHOS 97 03/06/2018 1030   BILITOT 0.5 03/06/2018 1030   GFRNONAA 57 (L) 03/06/2018 1030   GFRAA 66 03/06/2018 1030   Lab Results  Component Value Date   CHOL 252 (H) 03/06/2018   HDL 61 03/06/2018   LDLCALC 145 (H) 03/06/2018   TRIG 232 (H) 03/06/2018   CHOLHDL 4.1 03/06/2018   Lab Results  Component Value Date   HGBA1C 5.5 11/09/2016   No results found for: [id number] Lab Results  Component Value Date   TSH 3.740 03/06/2018      ASSESSMENT AND PLAN 62 y.o. year old male  has a past medical history of Gout, Hypogonadism in male, Mixed hyperlipidemia, NAFL (nonalcoholic fatty liver), Obesity, and OSA on CPAP (06/13/2014). here with:  1.  Obstructive sleep apnea on CPAP  The patient CPAP download shows excellent compliance and good treatment of his apnea.  He will continue using CPAP nightly and greater than 4 hours.  He is advised that if his symptoms worsen or he develop new symptoms he should let us know.  He will follow-up in 6 months or sooner if needed.   I spent 15 minutes with the patient. 50% of this time was spent reviewing his CPAP download   Butch PennyMegan Kanijah Groseclose, MSN, NP-C 05/07/2018, 11:19 AM [facility] [address], [address] [place], [place] [address] [phone]

## 2018-05-07 NOTE — Patient Instructions (Signed)
Your Plan:  Continue using CPAP nightly and >4 hours each night If your symptoms worsen or you develop new symptoms please let us know.   Thank you for coming to see us at [facility]. I hope we have been able to provide you high quality care today.  You may receive a patient satisfaction survey over the next few weeks. We would appreciate your feedback and comments so that we may continue to improve ourselves and the health of our patients.  

## 2018-08-31 ENCOUNTER — Encounter: Payer: Self-pay | Admitting: Physician Assistant

## 2018-09-02 ENCOUNTER — Other Ambulatory Visit: Payer: Self-pay | Admitting: Physician Assistant

## 2018-09-02 ENCOUNTER — Other Ambulatory Visit: Payer: Self-pay | Admitting: *Deleted

## 2018-09-02 MED ORDER — ALLOPURINOL 100 MG PO TABS: 100.0000 mg | ORAL_TABLET | Freq: Two times a day (BID) | ORAL | 0 refills | Status: DC

## 2018-10-01 ENCOUNTER — Other Ambulatory Visit: Payer: Self-pay | Admitting: Family Medicine

## 2018-10-01 NOTE — Telephone Encounter (Signed)
Requested Prescriptions  Pending Prescriptions Disp Refills  . allopurinol (ZYLOPRIM) 100 MG tablet [Pharmacy Med Name: ALLOPURINOL 100 MG TABLET] 90 tablet 0    Sig: TAKE 1 TABLET BY MOUTH TWICE A DAY     Endocrinology:  Gout Agents Failed - 10/01/2018  2:26 AM      Failed - Cr in normal range and within 360 days    Creatinine, Ser  Date Value Ref Range Status  03/06/2018 1.33 (H) 0.76 - 1.27 mg/dL Final         Passed - Uric Acid in normal range and within 360 days    Uric Acid  Date Value Ref Range Status  03/06/2018 6.8 3.7 - 8.6 mg/dL Final    Comment:               Therapeutic target for gout patients: <6.0         Passed - Valid encounter within last 12 months    Recent Outpatient Visits          6 months ago Annual physical exam   [facility] at [place], [place] D, PA-C   1 year ago Telangiectasia of face   [facility] at [facility], Myrle Sheng, MD   1 year ago Routine physical examination   [facility] at [facility], Myrle Sheng, MD   1 year ago OSA on CPAP   [facility] at [facility], Myrle Sheng, MD   2 years ago Cough   Primary Care at [facility], Gerald Stabs, [place]

## 2019-05-03 ENCOUNTER — Telehealth: Payer: Self-pay | Admitting: Adult Health

## 2019-05-03 NOTE — Telephone Encounter (Signed)
I called patient and LVM regarding rescheduling 8/18 appt per MM. Requested patient call back to reschedule.

## 2019-05-04 NOTE — Telephone Encounter (Signed)
I called patient again today and was able to reschedule his appointment. Due to Dr. Brett Fairy and Jinny Blossom NP having no availability until October, I scheduled a 1 time visit for patient with Amy NP for his CPAP on 8/18. Patient verbalized understanding and appreciation.

## 2019-05-11 ENCOUNTER — Ambulatory Visit: Payer: BC Managed Care – PPO | Admitting: Adult Health

## 2019-05-11 ENCOUNTER — Other Ambulatory Visit: Payer: Self-pay

## 2019-05-11 ENCOUNTER — Encounter: Payer: Self-pay | Admitting: Family Medicine

## 2019-05-11 ENCOUNTER — Ambulatory Visit (INDEPENDENT_AMBULATORY_CARE_PROVIDER_SITE_OTHER): Payer: BC Managed Care – PPO | Admitting: Family Medicine

## 2019-05-11 VITALS — BP 113/77 | HR 76 | Temp 97.8°F | Ht 68.0 in | Wt 230.4 lb

## 2019-05-11 DIAGNOSIS — G4733 Obstructive sleep apnea (adult) (pediatric): Secondary | ICD-10-CM

## 2019-05-11 DIAGNOSIS — Z9989 Dependence on other enabling machines and devices: Secondary | ICD-10-CM

## 2019-05-11 NOTE — Progress Notes (Signed)
PATIENT: Eric Johns DOB: 11/05/1955  REASON FOR VISIT: follow up HISTORY FROM: patient  Chief Complaint  Patient presents with   Follow-up    Yearly f/u. Alone. Rm 1. Patient mentioned that he has 2 different machines that he uses. He stated that he has one at his mother in laws house and then he has one at his house.      HISTORY OF PRESENT ILLNESS: Today 05/12/19 Eric Johns is a 63 y.o. male here today for follow up of OSA on CPAP.  He reports doing very well with therapy.  He states that he does have 2 separate CPAP machines at home.  One is an older machine that was originally his father in-law's.  He has been caring for his ill mother-in-law.  He uses his machine when he is at home.  He uses his father in law's machine when he is caring for his mother-in-law.  Compliance report dated 04/09/2019 through 05/08/2019 reveals that he has used CPAP 15 of the last 30 days.  15 of those days greater than 4 hours.  Average usage was 6 hours and 44 minutes.  AHI was well managed at 3.7 on 6 to 12 cm of water and an EPR of 3.  There was a mild leak noted at 24.7.  He states that he will attempt to locate card for additional machine.  He reports that he is using CPAP therapy every night.  He does travel often and reports taking his CPAP machine with him when he travels.  He notes significant benefit with using CPAP therapy.  HISTORY: (copied from [place] Johns's note on 05/07/2018)  Eric Johns is a 63 year old male with a history of obstructive sleep apnea on CPAP.  He returns today for follow-up.  His CPAP download indicates that he use his machine nightly for compliance of 100%.  He uses machine greater than 4 hours each night.  On average he uses his machine 7 hours and 16 minutes.  His residual AHI is 4.4 on 6 to 12 cm of water with EPR 3.  He reports that the CPAP continues to work well for him.  He reports that he takes it everywhere he goes.  He denies any new neurological symptoms.   He returns today for evaluation.    HISTORY 05/06/2017, Eric Johns is a compliant CPAP user who has used a CPAP for the last 30 days it 100%, his AHI is 4.9, his using an AutoSet between 4 and 10 cm water with 3 cm expiratory pressure relief the pressure at the 95th percentile is 9.7cm. There is room to increase the pressure a little bit more. I'm very happy with his compliance time an average of 6 hours and 16 minutes each night. He may need 12 cm water. He needs to use a travel friendly machine.  [place], he worked in [place] the month before. He will buy a travel CPAP outright.    REVIEW OF SYSTEMS: Out of a complete 14 system review of symptoms, the patient complains only of the following symptoms, none and all other reviewed systems are negative.  Epworth sleepiness scale: 3 Fatigue severity scale: 16  ALLERGIES: No Known Allergies  HOME MEDICATIONS: Outpatient Medications Prior to Visit  Medication Sig Dispense Refill   allopurinol (ZYLOPRIM) 100 MG tablet TAKE 1 TABLET BY MOUTH TWICE A DAY 90 tablet 0   aspirin 81 MG tablet Take 81 mg by mouth daily.  colchicine 0.6 MG tablet Take 1 tablet (0.6 mg total) by mouth 2 (two) times daily as needed. 60 tablet 3   diclofenac sodium (VOLTAREN) 1 % GEL Apply 2 g topically 4 (four) times daily. 100 g 0   Melatonin 3 MG TABS Take 3 mg by mouth at bedtime.      Multiple Vitamins-Minerals (MULTIVITAMIN PO) Take by mouth. Taking daily ( [facility] pack)     triamcinolone (NASACORT) 55 MCG/ACT AERO nasal inhaler Place into the nose.     doxycycline (VIBRAMYCIN) 100 MG capsule Take 1 capsule (100 mg total) by mouth daily. Start 48 hours prior to departure; continue for four weeks after return (Patient not taking: Reported on 05/11/2019) 60 capsule 1   No facility-administered medications prior to visit.     PAST MEDICAL HISTORY: Past Medical History:  Diagnosis Date   Gout    Hypogonadism in male    Mixed  hyperlipidemia    NAFL (nonalcoholic fatty liver)    Obesity    OSA on CPAP 06/13/2014    PAST SURGICAL HISTORY: Past Surgical History:  Procedure Laterality Date   APPENDECTOMY  1972    FAMILY HISTORY: Family History  Problem Relation Age of Onset   Diverticulitis Father    Diabetes Maternal Grandfather    Emphysema Maternal Grandfather     SOCIAL HISTORY: Social History   Socioeconomic History   Marital status: Married    Spouse name: Eric Johns   Number of children: 3   Years of education: College   Highest education level: Not on file  Occupational History   Occupation: Animal nutritionistpecial Investigator    Comment: Transport plannertate [employer]  [employer]   Financial resource strain: Not hard at all   Food insecurity    Worry: Never true    Inability: Never true   Transportation needs    Medical: No    Non-medical: No  Tobacco Use   Smoking status: Never Smoker   Smokeless tobacco: Former NeurosurgeonUser    Types: Snuff  Substance and Sexual Activity   Alcohol use: Yes    Alcohol/week: 1.0 - 2.0 standard drinks    Types: 1 - 2 Glasses of wine per week   Drug use: No   Sexual activity: Not Currently  Lifestyle   Physical activity    Days per week: 0 days    Minutes per session: 0 min   Stress: To some extent  Relationships   Social connections    Talks on phone: More than three times a week    Gets together: Never    Attends religious service: More than 4 times per year    Active member of club or organization: Yes    Attends meetings of clubs or organizations: More than 4 times per year    Relationship status: Married   Intimate partner violence    Fear of current or ex partner: No    Emotionally abused: No    Physically abused: No    Forced sexual activity: No  Other Topics Concern   Not on file  Social History Narrative   Patient is married Eric Land(Angela). He and his wife live at different locations.    Patient is retired and working part-time.     Patient is right-handed.   Patient has three children and his wife has two children.      PHYSICAL EXAM  Vitals:   05/11/19 1423  BP: 113/77  Pulse: 76  Temp: 97.8 F (36.6 C)  TempSrc: Oral  Weight: 230 lb 6.4 oz (104.5 kg)  Height: 5\' 8"  (1.727 m)   Body mass index is 35.03 kg/m.  Generalized: Well developed, in no acute distress  Cardiology: normal rate and rhythm, no murmur noted Respiratory: Clear to auscultation bilaterally mallampati 3+ Neurological examination  Mentation: Alert oriented to time, place, history taking. Follows all commands speech and language fluent Cranial nerve II-XII: Pupils were equal round reactive to light. Extraocular movements were full, visual field were full on confrontational test. Facial sensation and strength were normal. Uvula tongue midline. Head turning and shoulder shrug  were normal and symmetric. Motor: The motor testing reveals 5 over 5 strength of all 4 extremities. Good symmetric motor tone is noted throughout.  Gait and station: Gait is normal.   DIAGNOSTIC DATA (LABS, IMAGING, TESTING) - I reviewed patient records, labs, notes, testing and imaging myself where available.  MMSE - Mini Mental State Exam 03/06/2018 03/06/2018  Orientation to time 5 5  Orientation to Place 5 5  Registration 3 3  Attention/ Calculation 5 5  Recall 3 3  Language- name 2 objects 2 2  Language- repeat 1 1  Language- follow 3 step command 3 3  Language- read & follow direction 1 1  Write a sentence 1 1  Copy design 1 1  Total score 30 30     Lab Results  Component Value Date   WBC 7.3 03/06/2018   HGB 18.7 (H) 03/06/2018   HCT 53.8 (H) 03/06/2018   MCV 94 03/06/2018   PLT 235 03/06/2018      Component Value Date/Time   NA 139 03/06/2018 1030   K 4.7 03/06/2018 1030   CL 103 03/06/2018 1030   CO2 21 03/06/2018 1030   GLUCOSE 94 03/06/2018 1030   BUN 17 03/06/2018 1030   CREATININE 1.33 (H) 03/06/2018 1030   CALCIUM 10.2 03/06/2018  1030   PROT 7.8 03/06/2018 1030   ALBUMIN 5.1 (H) 03/06/2018 1030   AST 52 (H) 03/06/2018 1030   ALT 60 (H) 03/06/2018 1030   ALKPHOS 97 03/06/2018 1030   BILITOT 0.5 03/06/2018 1030   GFRNONAA 57 (L) 03/06/2018 1030   GFRAA 66 03/06/2018 1030   Lab Results  Component Value Date   CHOL 252 (H) 03/06/2018   HDL 61 03/06/2018   LDLCALC 145 (H) 03/06/2018   TRIG 232 (H) 03/06/2018   CHOLHDL 4.1 03/06/2018   Lab Results  Component Value Date   HGBA1C 5.5 11/09/2016   No results found for: [id number] Lab Results  Component Value Date   TSH 3.740 03/06/2018     ASSESSMENT AND PLAN 63 y.o. year old male  has a past medical history of Gout, Hypogonadism in male, Mixed hyperlipidemia, NAFL (nonalcoholic fatty liver), Obesity, and OSA on CPAP (06/13/2014). here with     ICD-10-CM   1. OSA on CPAP  G47.33    Z99.89     Ronalee Belts reports that he is doing very well on CPAP therapy.  Compliance report reveals 50% compliance, however, he reports using another machine at least 3 to 4 days a week.  He was instructed to contact servicer of that machine to see if we could get card download.  We have discussed insurance requirements regarding compliance.  He is adamant that he is compliant nightly.  He was encouraged to continue using CPAP nightly and for greater than 4 hours each night.  We will follow-up in 1 year, sooner if needed.  He verbalizes understanding and agreement with  this plan.   No orders of the defined types were placed in this encounter.    No orders of the defined types were placed in this encounter.     I spent 15 minutes with the patient. 50% of this time was spent counseling and educating patient on plan of care and medications.    Shawnie Dappermy Recardo Linn, FNP-C 05/12/2019, 8:57 AM [facility] [address], [address] [place], [place] [address] [phone]

## 2019-05-11 NOTE — Patient Instructions (Signed)
Continue CPAP nightly and for greater than 4 hours each night  Bring in additional machine card for download  Follow up in 1 year  Sleep Apnea Sleep apnea affects breathing during sleep. It causes breathing to stop for a short time or to become shallow. It can also increase the risk of:  Heart attack.  Stroke.  Being very overweight (obese).  Diabetes.  Heart failure.  Irregular heartbeat. The goal of treatment is to help you breathe normally again. What are the causes? There are three kinds of sleep apnea:  Obstructive sleep apnea. This is caused by a blocked or collapsed airway.  Central sleep apnea. This happens when the brain does not send the right signals to the muscles that control breathing.  Mixed sleep apnea. This is a combination of obstructive and central sleep apnea. The most common cause of this condition is a collapsed or blocked airway. This can happen if:  Your throat muscles are too relaxed.  Your tongue and tonsils are too large.  You are overweight.  Your airway is too small. What increases the risk?  Being overweight.  Smoking.  Having a small airway.  Being older.  Being male.  Drinking alcohol.  Taking medicines to calm yourself (sedatives or tranquilizers).  Having family members with the condition. What are the signs or symptoms?  Trouble staying asleep.  Being sleepy or tired during the day.  Getting angry a lot.  Loud snoring.  Headaches in the morning.  Not being able to focus your mind (concentrate).  Forgetting things.  Less interest in sex.  Mood swings.  Personality changes.  Feelings of sadness (depression).  Waking up a lot during the night to pee (urinate).  Dry mouth.  Sore throat. How is this diagnosed?  Your medical history.  A physical exam.  A test that is done when you are sleeping (sleep study). The test is most often done in a sleep lab but may also be done at home. How is this  treated?   Sleeping on your side.  Using a medicine to get rid of mucus in your nose (decongestant).  Avoiding the use of alcohol, medicines to help you relax, or certain pain medicines (narcotics).  Losing weight, if needed.  Changing your diet.  Not smoking.  Using a machine to open your airway while you sleep, such as: ? An oral appliance. This is a mouthpiece that shifts your lower jaw forward. ? A CPAP device. This device blows air through a mask when you breathe out (exhale). ? An EPAP device. This has valves that you put in each nostril. ? A BPAP device. This device blows air through a mask when you breathe in (inhale) and breathe out.  Having surgery if other treatments do not work. It is important to get treatment for sleep apnea. Without treatment, it can lead to:  High blood pressure.  Coronary artery disease.  In men, not being able to have an erection (impotence).  Reduced thinking ability. Follow these instructions at home: Lifestyle  Make changes that your doctor recommends.  Eat a healthy diet.  Lose weight if needed.  Avoid alcohol, medicines to help you relax, and some pain medicines.  Do not use any products that contain nicotine or tobacco, such as cigarettes, e-cigarettes, and chewing tobacco. If you need help quitting, ask your doctor. General instructions  Take over-the-counter and prescription medicines only as told by your doctor.  If you were given a machine to use while you  sleep, use it only as told by your doctor.  If you are having surgery, make sure to tell your doctor you have sleep apnea. You may need to bring your device with you.  Keep all follow-up visits as told by your doctor. This is important. Contact a doctor if:  The machine that you were given to use during sleep bothers you or does not seem to be working.  You do not get better.  You get worse. Get help right away if:  Your chest hurts.  You have trouble  breathing in enough air.  You have an uncomfortable feeling in your back, arms, or stomach.  You have trouble talking.  One side of your body feels weak.  A part of your face is hanging down. These symptoms may be an emergency. Do not wait to see if the symptoms will go away. Get medical help right away. Call your local emergency services (911 in the [place] Do not drive yourself to the hospital. Summary  This condition affects breathing during sleep.  The most common cause is a collapsed or blocked airway.  The goal of treatment is to help you breathe normally while you sleep. This information is not intended to replace advice given to you by your health care provider. Make sure you discuss any questions you have with your health care provider. Document Released: 06/18/2008 Document Revised: 06/26/2018 Document Reviewed: 05/05/2018 Elsevier Patient Education  2020 [employer]

## 2019-05-12 ENCOUNTER — Encounter: Payer: Self-pay | Admitting: Family Medicine

## 2019-07-21 ENCOUNTER — Other Ambulatory Visit: Payer: Self-pay

## 2019-07-21 DIAGNOSIS — Z20822 Contact with and (suspected) exposure to covid-19: Secondary | ICD-10-CM

## 2019-07-23 LAB — NOVEL CORONAVIRUS, NAA: SARS-CoV-2, NAA: NOT DETECTED

## 2020-05-10 NOTE — Patient Instructions (Signed)
Please continue using your CPAP regularly. While your insurance requires that you use CPAP at least 4 hours each night on 70% of the nights, I recommend, that you not skip any nights and use it throughout the night if you can. Getting used to CPAP and staying with the treatment long term does take time and patience and discipline. Untreated obstructive sleep apnea when it is moderate to severe can have an adverse impact on cardiovascular health and raise her risk for heart disease, arrhythmias, hypertension, congestive heart failure, stroke and diabetes. Untreated obstructive sleep apnea causes sleep disruption, nonrestorative sleep, and sleep deprivation. This can have an impact on your day to day functioning and cause daytime sleepiness and impairment of cognitive function, memory loss, mood disturbance, and problems focussing. Using CPAP regularly can improve these symptoms.   Follow up in 1 year   Sleep Apnea Sleep apnea affects breathing during sleep. It causes breathing to stop for a short time or to become shallow. It can also increase the risk of:  Heart attack.  Stroke.  Being very overweight (obese).  Diabetes.  Heart failure.  Irregular heartbeat. The goal of treatment is to help you breathe normally again. What are the causes? There are three kinds of sleep apnea:  Obstructive sleep apnea. This is caused by a blocked or collapsed airway.  Central sleep apnea. This happens when the brain does not send the right signals to the muscles that control breathing.  Mixed sleep apnea. This is a combination of obstructive and central sleep apnea. The most common cause of this condition is a collapsed or blocked airway. This can happen if:  Your throat muscles are too relaxed.  Your tongue and tonsils are too large.  You are overweight.  Your airway is too small. What increases the risk?  Being overweight.  Smoking.  Having a small airway.  Being older.  Being  male.  Drinking alcohol.  Taking medicines to calm yourself (sedatives or tranquilizers).  Having family members with the condition. What are the signs or symptoms?  Trouble staying asleep.  Being sleepy or tired during the day.  Getting angry a lot.  Loud snoring.  Headaches in the morning.  Not being able to focus your mind (concentrate).  Forgetting things.  Less interest in sex.  Mood swings.  Personality changes.  Feelings of sadness (depression).  Waking up a lot during the night to pee (urinate).  Dry mouth.  Sore throat. How is this diagnosed?  Your medical history.  A physical exam.  A test that is done when you are sleeping (sleep study). The test is most often done in a sleep lab but may also be done at home. How is this treated?   Sleeping on your side.  Using a medicine to get rid of mucus in your nose (decongestant).  Avoiding the use of alcohol, medicines to help you relax, or certain pain medicines (narcotics).  Losing weight, if needed.  Changing your diet.  Not smoking.  Using a machine to open your airway while you sleep, such as: ? An oral appliance. This is a mouthpiece that shifts your lower jaw forward. ? A CPAP device. This device blows air through a mask when you breathe out (exhale). ? An EPAP device. This has valves that you put in each nostril. ? A BPAP device. This device blows air through a mask when you breathe in (inhale) and breathe out.  Having surgery if other treatments do not work. It is   important to get treatment for sleep apnea. Without treatment, it can lead to:  High blood pressure.  Coronary artery disease.  In men, not being able to have an erection (impotence).  Reduced thinking ability. Follow these instructions at home: Lifestyle  Make changes that your doctor recommends.  Eat a healthy diet.  Lose weight if needed.  Avoid alcohol, medicines to help you relax, and some pain  medicines.  Do not use any products that contain nicotine or tobacco, such as cigarettes, e-cigarettes, and chewing tobacco. If you need help quitting, ask your doctor. General instructions  Take over-the-counter and prescription medicines only as told by your doctor.  If you were given a machine to use while you sleep, use it only as told by your doctor.  If you are having surgery, make sure to tell your doctor you have sleep apnea. You may need to bring your device with you.  Keep all follow-up visits as told by your doctor. This is important. Contact a doctor if:  The machine that you were given to use during sleep bothers you or does not seem to be working.  You do not get better.  You get worse. Get help right away if:  Your chest hurts.  You have trouble breathing in enough air.  You have an uncomfortable feeling in your back, arms, or stomach.  You have trouble talking.  One side of your body feels weak.  A part of your face is hanging down. These symptoms may be an emergency. Do not wait to see if the symptoms will go away. Get medical help right away. Call your local emergency services (911 in the [place] Do not drive yourself to the hospital. Summary  This condition affects breathing during sleep.  The most common cause is a collapsed or blocked airway.  The goal of treatment is to help you breathe normally while you sleep. This information is not intended to replace advice given to you by your health care provider. Make sure you discuss any questions you have with your health care provider. Document Revised: 06/26/2018 Document Reviewed: 05/05/2018 Elsevier Patient Education  2020 Elsevier Inc.  

## 2020-05-10 NOTE — Progress Notes (Signed)
PATIENT: Eric Johns DOB: 05/09/1956  REASON FOR VISIT: follow up HISTORY FROM: patient  Chief Complaint  Patient presents with  . Follow-up    42yr f/u for OSA, states he has been doing well since last visit. Uses machine nightly.   . room 1    alone      HISTORY OF PRESENT ILLNESS: Today 05/11/20 Eric Johns is a 64 y.o. male here today for follow up for OSA on CPAP.  He reports that he is doing well with CPAP therapy at home.  He continues to use CPAP nightly.  He denies any concerns with his machine.  He continues to be the primary caregiver for his mother-in-law.  Compliance report dated 04/08/2020 through 05/08/2020 reveals that he used CPAP 30 of the past 30 days for compliance of 100%.  He used CPAP greater than 4 hours all 30 days for compliance of 100%.  Residual AHI was 4.4 on 6 to 12 cm of water and an EPR of 3.  There was a leak noted in the 95th percentile of 22 L/min.  He states that leak is due to sleep positioning.  He is feeling well and without complaints today.   HISTORY: (copied from my note on 05/11/2019)  Eric Johns is a 64 y.o. male here today for follow up of OSA on CPAP.  He reports doing very well with therapy.  He states that he does have 2 separate CPAP machines at home.  One is an older machine that was originally his father in-law's.  He has been caring for his ill mother-in-law.  He uses his machine when he is at home.  He uses his father in law's machine when he is caring for his mother-in-law.  Compliance report dated 04/09/2019 through 05/08/2019 reveals that he has used CPAP 15 of the last 30 days.  15 of those days greater than 4 hours.  Average usage was 6 hours and 44 minutes.  AHI was well managed at 3.7 on 6 to 12 cm of water and an EPR of 3.  There was a mild leak noted at 24.7.  He states that he will attempt to locate card for additional machine.  He reports that he is using CPAP therapy every night.  He does travel often and reports  taking his CPAP machine with him when he travels.  He notes significant benefit with using CPAP therapy.  HISTORY: (copied from [place] note on 05/07/2018)  [name] is a 64 year old male with a history of obstructive sleep apnea on CPAP. He returns today for follow-up. His CPAP download indicates that he use his machine nightly for compliance of 100%. He uses machine greater than 4 hours each night. On average he uses his machine 7 hours and 16 minutes. His residual AHI is 4.4 on 6 to 12 cm of water with EPR 3. He reports that the CPAP continues to work well for him. He reports that he takes it everywhere he goes. He denies any new neurological symptoms. He returns today for evaluation.   HISTORY08/14/2018, [name] is a compliant CPAP user who has used a CPAP for the last 30 days it 100%, his AHI is 4.9, his using an AutoSet between 4 and 10 cm water with 3 cm expiratory pressure relief the pressure at the 95th percentile is 9.7cm. There is room to increase the pressure a little bit more. I'm very happy with his compliance time an average of 6 hours  and 16 minutes each night. He may need 12 cm water. He needs to use a travel friendly machine.  He worked in [place] last week, he worked in [place] the month before. He will buy a travel CPAP outright.   REVIEW OF SYSTEMS: Out of a complete 14 system review of symptoms, the patient complains only of the following symptoms, none and all other reviewed systems are negative.  ESS: 2  ALLERGIES: No Known Allergies  HOME MEDICATIONS: Outpatient Medications Prior to Visit  Medication Sig Dispense Refill  . allopurinol (ZYLOPRIM) 100 MG tablet TAKE 1 TABLET BY MOUTH TWICE A DAY 90 tablet 0  . aspirin 81 MG tablet Take 81 mg by mouth daily.    . colchicine 0.6 MG tablet Take 1 tablet (0.6 mg total) by mouth 2 (two) times daily as needed. 60 tablet 3  . diclofenac sodium (VOLTAREN) 1 % GEL Apply 2 g topically 4 (four) times  daily. 100 g 0  . Melatonin 3 MG TABS Take 3 mg by mouth at bedtime.     . Multiple Vitamins-Minerals (MULTIVITAMIN PO) Take by mouth. Taking daily ( GNC pack)    . triamcinolone (NASACORT) 55 MCG/ACT AERO nasal inhaler Place into the nose.    Marland Kitchen doxycycline (VIBRAMYCIN) 100 MG capsule Take 1 capsule (100 mg total) by mouth daily. Start 48 hours prior to departure; continue for four weeks after return (Patient not taking: Reported on 05/11/2019) 60 capsule 1   No facility-administered medications prior to visit.    PAST MEDICAL HISTORY: Past Medical History:  Diagnosis Date  . Gout   . Hypogonadism in male   . Mixed hyperlipidemia   . NAFL (nonalcoholic fatty liver)   . Obesity   . OSA on CPAP 06/13/2014    PAST SURGICAL HISTORY: Past Surgical History:  Procedure Laterality Date  . APPENDECTOMY  1972    FAMILY HISTORY: Family History  Problem Relation Age of Onset  . Diverticulitis Father   . Diabetes Maternal Grandfather   . Emphysema Maternal Grandfather     SOCIAL HISTORY: Social History   Socioeconomic History  . Marital status: Married    Spouse name: [name]  . Number of children: 3  . Years of education: College  . Highest education level: Not on file  Occupational History  . Occupation: Animal nutritionist    Comment: [employer]  Tobacco Use  . Smoking status: Never Smoker  . Smokeless tobacco: Former Neurosurgeon    Types: Snuff  Substance and Sexual Activity  . Alcohol use: Yes    Alcohol/week: 1.0 - 2.0 standard drink    Types: 1 - 2 Glasses of wine per week  . Drug use: No  . Sexual activity: Not Currently  Other Topics Concern  . Not on file  Social History Narrative   Patient is married [name]). He and his wife live at different locations.    Patient is retired and working part-time.    Patient is right-handed.   Patient has three children and his wife has two children.   Social Determinants of Health   Financial Resource Strain:     . Difficulty of Paying Living Expenses: Not on file  Food Insecurity:   . Worried About Programme researcher, broadcasting/film/video in the Last Year: Not on file  . Ran Out of Food in the Last Year: Not on file  Transportation Needs:   . Lack of Transportation (Medical): Not on file  . Lack of Transportation (Non-Medical): Not on  file  Physical Activity:   . Days of Exercise per Week: Not on file  . Minutes of Exercise per Session: Not on file  Stress:   . Feeling of Stress : Not on file  Social Connections:   . Frequency of Communication with Friends and Family: Not on file  . Frequency of Social Gatherings with Friends and Family: Not on file  . Attends Religious Services: Not on file  . Active Member of Clubs or Organizations: Not on file  . Attends Banker Meetings: Not on file  . Marital Status: Not on file  Intimate Partner Violence:   . Fear of Current or Ex-Partner: Not on file  . Emotionally Abused: Not on file  . Physically Abused: Not on file  . Sexually Abused: Not on file      PHYSICAL EXAM  Vitals:   05/11/20 1403  BP: (!) 143/85  Pulse: 88  Weight: 231 lb 3.2 oz (104.9 kg)  Height: 5\' 8"  (1.727 m)   Body mass index is 35.15 kg/m.  Generalized: Well developed, in no acute distress  Cardiology: normal rate and rhythm, no murmur noted Respiratory: clear to auscultation bilaterally  Neurological examination  Mentation: Alert oriented to time, place, history taking. Follows all commands speech and language fluent Cranial nerve II-XII: Pupils were equal round reactive to light. Extraocular movements were full, visual field were full  Motor: The motor testing reveals 5 over 5 strength of all 4 extremities. Good symmetric motor tone is noted throughout.  Gait and station: Gait is normal.   DIAGNOSTIC DATA (LABS, IMAGING, TESTING) - I reviewed patient records, labs, notes, testing and imaging myself where available.  MMSE - Mini Mental State Exam 03/06/2018 03/06/2018   Orientation to time 5 5  Orientation to Place 5 5  Registration 3 3  Attention/ Calculation 5 5  Recall 3 3  Language- name 2 objects 2 2  Language- repeat 1 1  Language- follow 3 step command 3 3  Language- read & follow direction 1 1  Write a sentence 1 1  Copy design 1 1  Total score 30 30     Lab Results  Component Value Date   WBC 7.3 03/06/2018   HGB 18.7 (H) 03/06/2018   HCT 53.8 (H) 03/06/2018   MCV 94 03/06/2018   PLT 235 03/06/2018      Component Value Date/Time   NA 139 03/06/2018 1030   K 4.7 03/06/2018 1030   CL 103 03/06/2018 1030   CO2 21 03/06/2018 1030   GLUCOSE 94 03/06/2018 1030   BUN 17 03/06/2018 1030   CREATININE 1.33 (H) 03/06/2018 1030   CALCIUM 10.2 03/06/2018 1030   PROT 7.8 03/06/2018 1030   ALBUMIN 5.1 (H) 03/06/2018 1030   AST 52 (H) 03/06/2018 1030   ALT 60 (H) 03/06/2018 1030   ALKPHOS 97 03/06/2018 1030   BILITOT 0.5 03/06/2018 1030   GFRNONAA 57 (L) 03/06/2018 1030   GFRAA 66 03/06/2018 1030   Lab Results  Component Value Date   CHOL 252 (H) 03/06/2018   HDL 61 03/06/2018   LDLCALC 145 (H) 03/06/2018   TRIG 232 (H) 03/06/2018   CHOLHDL 4.1 03/06/2018   Lab Results  Component Value Date   HGBA1C 5.5 11/09/2016   No results found for: 11/11/2016 Lab Results  Component Value Date   TSH 3.740 03/06/2018       ASSESSMENT AND PLAN 64 y.o. year old male  has a past medical history of  Gout, Hypogonadism in male, Mixed hyperlipidemia, NAFL (nonalcoholic fatty liver), Obesity, and OSA on CPAP (06/13/2014). here with     ICD-10-CM   1. OSA on CPAP  G47.33    Z99.89     [name] continues to do well on CPAP therapy.  Compliance report reveals excellent compliance.  Leak continues to be noted on compliance report, however, residual AHI is normal.  He is comfortable with his current mask.  He was encouraged to continue using CPAP nightly and for greater than 4 hours each night.  He will continue to work on healthy lifestyle habits.   He will monitor blood pressures at home.  He will follow-up with us annually, sooner if needed.  He verbalizes understanding and agreement with this plan.   No orders of the defined types were placed in this encounter.    No orders of the defined types were placed in this encounter.     I spent 15 minutes with the patient. 50% of this time was spent counseling and educating patient on plan of care and medications.    Shawnie Dappermy Trenten Watchman, FNP-C 05/11/2020, 2:10 PM [facility] [address], [address] [place], [place] [address] [phone]

## 2020-05-11 ENCOUNTER — Ambulatory Visit (INDEPENDENT_AMBULATORY_CARE_PROVIDER_SITE_OTHER): Payer: BC Managed Care – PPO | Admitting: Family Medicine

## 2020-05-11 ENCOUNTER — Encounter: Payer: Self-pay | Admitting: Family Medicine

## 2020-05-11 VITALS — BP 143/85 | HR 88 | Ht 68.0 in | Wt 231.2 lb

## 2020-05-11 DIAGNOSIS — G4733 Obstructive sleep apnea (adult) (pediatric): Secondary | ICD-10-CM

## 2020-05-11 DIAGNOSIS — Z9989 Dependence on other enabling machines and devices: Secondary | ICD-10-CM | POA: Diagnosis not present

## 2020-05-11 NOTE — Progress Notes (Signed)
Order for cpap supplies sent to [facility] via community msg. Confirmation received that the order transmitted was successful. 

## 2020-08-07 ENCOUNTER — Encounter: Payer: Self-pay | Admitting: Cardiovascular Disease

## 2020-08-29 ENCOUNTER — Ambulatory Visit (INDEPENDENT_AMBULATORY_CARE_PROVIDER_SITE_OTHER): Payer: Federal, State, Local not specified - PPO | Admitting: Cardiovascular Disease

## 2020-08-29 ENCOUNTER — Telehealth: Payer: Self-pay

## 2020-08-29 ENCOUNTER — Other Ambulatory Visit: Payer: Self-pay

## 2020-08-29 VITALS — BP 140/90 | HR 79 | Ht 68.0 in | Wt 239.0 lb

## 2020-08-29 DIAGNOSIS — Z6836 Body mass index (BMI) 36.0-36.9, adult: Secondary | ICD-10-CM

## 2020-08-29 DIAGNOSIS — I499 Cardiac arrhythmia, unspecified: Secondary | ICD-10-CM

## 2020-08-29 DIAGNOSIS — E6609 Other obesity due to excess calories: Secondary | ICD-10-CM

## 2020-08-29 DIAGNOSIS — E782 Mixed hyperlipidemia: Secondary | ICD-10-CM | POA: Diagnosis not present

## 2020-08-29 DIAGNOSIS — R079 Chest pain, unspecified: Secondary | ICD-10-CM

## 2020-08-29 NOTE — Progress Notes (Addendum)
Cardiology Office Note  Date:  08/29/2020   ID:  Eric Johns, DOB December 12, 1955, MRN [id number]  PCP:  Eric Chick, MD   Chief Complaint  Patient presents with  . New Patient (Initial Visit)    Hospital F/U; Meds verbally reviewed with patient.    HPI:  Mr. Eric Johns is a 64 year old gentleman with past medical history of Obesity OSA on CPAP Gout Who presents to cardiology office after recent discharge from the hospital, diagnosis of chest pain, possible arrhythmia  Reported having some atypical, stabbing chest pain with past several weeks At rest and with exertion Reports under a lot of stress, takes care of her mother-in-law, other issues going on, work stress  Seen by primary care, told he had arrhythmia on his EKG and it did not look right and was sent to ER In the emergency room August 07, 2020, enz neg Stayed overnight, stress test (echo stress) no ischemia, normal echo Started on metoprolol, Xarelto after EKG was read as atrial fibrillation, PA confirm diagnosis of atrial fibrillation based on EKG reading Treated with prednisone and doxycycline for cough  Since leaving the hospital, has had persistent cough, though improving Occasional chest pain but slowly improving  Out of medications including metoprolol  Swelling in hands/right knee, some swelling in the knee with some knee pain  Unable to exercise secondary to knee pain  EKG personally reviewed by myself on todays visit NSR rate rate 79 bpm  EKG on 08/07/20 :bad copy, suggestive of NSR not atrial fibrillation Will get large clear copy   PMH:   has a past medical history of Gout, Hypogonadism in male, Mixed hyperlipidemia, NAFL (nonalcoholic fatty liver), Obesity, and OSA on CPAP (06/13/2014).  PSH:    Past Surgical History:  Procedure Laterality Date  . APPENDECTOMY  1972    Current Outpatient Medications  Medication Sig Dispense Refill  . allopurinol (ZYLOPRIM) 100 MG tablet TAKE 1  TABLET BY MOUTH TWICE A DAY 90 tablet 0  . aspirin 81 MG tablet Take 81 mg by mouth daily.    . colchicine 0.6 MG tablet Take 1 tablet (0.6 mg total) by mouth 2 (two) times daily as needed. 60 tablet 3  . diclofenac sodium (VOLTAREN) 1 % GEL Apply 2 g topically 4 (four) times daily. 100 g 0  . Melatonin 3 MG TABS Take 3 mg by mouth at bedtime.     . metoprolol succinate (TOPROL-XL) 25 MG 24 hr tablet Take 25 mg by mouth daily.     . Multiple Vitamins-Minerals (MULTIVITAMIN PO) Take by mouth. Taking daily ( GNC pack)    . triamcinolone (NASACORT) 55 MCG/ACT AERO nasal inhaler Place into the nose.    Eric Johns 20 MG TABS tablet Take 20 mg by mouth at bedtime.     No current facility-administered medications for this visit.     Allergies:   Patient has no known allergies.   Social History:  The patient  reports that he has never smoked. He quit smokeless tobacco use about 13 years ago.  His smokeless tobacco use included snuff. He reports current alcohol use of about 1.0 - 2.0 standard drink of alcohol per week. He reports that he does not use drugs.   Family History:   family history includes Diabetes in his maternal grandfather; Diverticulitis in his father; Emphysema in his maternal grandfather.    Review of Systems: Review of Systems  Constitutional: Negative.   HENT: Negative.   Respiratory: Negative.  Cardiovascular: Negative.   Gastrointestinal: Negative.   Musculoskeletal: Negative.   Neurological: Negative.   Psychiatric/Behavioral: Negative.   All other systems reviewed and are negative.   PHYSICAL EXAM: VS:  BP 140/90 (BP Location: Right Arm, Patient Position: Sitting, Cuff Size: Large)   Pulse 79   Ht 5\' 8"  (1.727 m)   Wt 239 lb (108.4 kg)   SpO2 97%   BMI 36.34 kg/m  , BMI Body mass index is 36.34 kg/m. GEN: Well nourished, well developed, in no acute distress HEENT: normal Neck: no JVD, carotid bruits, or masses Cardiac: RRR; no murmurs, rubs, or gallops,no  edema  Respiratory:  clear to auscultation bilaterally, normal work of breathing GI: soft, nontender, nondistended, + BS MS: no deformity or atrophy Skin: warm and dry, no rash Neuro:  Strength and sensation are intact Psych: euthymic mood, full affect  Recent Labs: No results found for requested labs within last 8760 hours.    Lipid Panel Lab Results  Component Value Date   CHOL 252 (H) 03/06/2018   HDL 61 03/06/2018   LDLCALC 145 (H) 03/06/2018   TRIG 232 (H) 03/06/2018     Lab work looked at from July 2021 total cholesterol 226 LDL 128  Wt Readings from Last 3 Encounters:  08/29/20 239 lb (108.4 kg)  05/11/20 231 lb 3.2 oz (104.9 kg)  05/11/19 230 lb 6.4 oz (104.5 kg)       ASSESSMENT AND PLAN:  Problem List Items Addressed This Visit    None    Visit Diagnoses    Chest pain of uncertain etiology    -  Primary   Relevant Orders   EKG 12-Lead   Cardiac arrhythmia, unspecified cardiac arrhythmia type       Relevant Medications   metoprolol succinate (TOPROL-XL) 25 MG 24 hr tablet   XARELTO 20 MG TABS tablet   Class 2 obesity due to excess calories without serious comorbidity with body mass index (BMI) of 36.0 to 36.9 in adult       Mixed hyperlipidemia       Relevant Medications   metoprolol succinate (TOPROL-XL) 25 MG 24 hr tablet   XARELTO 20 MG TABS tablet     Chest pain Normal stress echo Negative cardiac enzymes, normal EKG in the hospital EKG from primary care office has been requested to clarify rhythm Symptoms are atypical in nature, possibly from recent bronchitis/cough Recommend he try NSAIDs, cough suppressant, guaifenesin If symptoms do not improve recommended he call our office  Cardiac arrhythmia I am concerned that he was in normal sinus rhythm and EKG was documented incorrectly Copy of EKG we have a small and hazy low concerning for regular rate and rhythm We have requested full EKG for better clarification If this confirms normal sinus  rhythm he would not need Xarelto Addendum: EKG received from [facility], reviewed by myself and by EP, Dr. 05/13/19 EKG is actually normal sinus rhythm not atrial fibrillation as detailed Anticoagulation held  Essential hypertension  recommend he monitor blood pressure at home Reports it is typically well controlled even without metoprolol  Hyperlipidemia Numbers mildly elevated, total cholesterol 226 Lifestyle modification recommended Could consider screening study with CT coronary calcium scoring for risk stratification   Total encounter time more than 60 minutes  Greater than 50% was spent in counseling and coordination of care with the patient    Signed, Lalla Brothers, M.D., Ph.D. [facility] [place], [place]

## 2020-08-29 NOTE — Telephone Encounter (Signed)
Fax sent to [facility] in [place] for pt's EKG reading from 11/15 with pt's signed consent.

## 2020-08-29 NOTE — Patient Instructions (Addendum)
We will fax request to [facility] for original EKG from [facility] dated 08/07/20 at 11:31 Am with your signed consent    Medication Instructions:  No changes  Call if need lasix as needed for swelling  If you need a refill on your cardiac medications before your next appointment, please call your pharmacy.    Lab work: No new labs needed   If you have labs (blood work) drawn today and your tests are completely normal, you will receive your results only by: Marland Kitchen MyChart Message (if you have MyChart) OR . A paper copy in the mail If you have any lab test that is abnormal or we need to change your treatment, we will call you to review the results.   Testing/Procedures: No new testing needed   Follow-Up: At [facility], you and your health needs are our priority.  As part of our continuing mission to provide you with exceptional heart care, we have created designated Provider Care Teams.  These Care Teams include your primary Cardiologist (physician) and Advanced Practice Providers (APPs -  Physician Assistants and Nurse Practitioners) who all work together to provide you with the care you need, when you need it.  . You will need a follow up appointment as needed  . Providers on your designated Care Team:   . Nicolasa Ducking, NP . Eula Listen, PA-C . Marisue Ivan, PA-C  Any Other Special Instructions Will Be Listed Below (If Applicable).  COVID-19 Vaccine Information can be found at: [email or web address] For questions related to vaccine distribution or appointments, please email [email or web address] [email or web address] or call [phone].

## 2020-08-31 ENCOUNTER — Telehealth: Payer: Self-pay

## 2020-08-31 NOTE — Telephone Encounter (Signed)
[facility] has faxed over pt's EKG from 08/07/2020 @ 11:31am. This was placed on Dr. Windell Hummingbird desk for review.

## 2020-09-27 ENCOUNTER — Telehealth: Payer: Self-pay

## 2020-09-27 NOTE — Telephone Encounter (Signed)
Called pt to left him know what Dr. Mariah Milling stated about his EKGs that he consulted with our EP  "They agree it is normal sinus rhythm  He does not need blood thinners"  Pt is delighted of news, has not questions or concerns.

## 2020-11-21 ENCOUNTER — Other Ambulatory Visit: Payer: Self-pay

## 2020-11-21 ENCOUNTER — Encounter: Payer: Self-pay | Admitting: Pulmonary Disease

## 2020-11-21 ENCOUNTER — Other Ambulatory Visit
Admission: RE | Admit: 2020-11-21 | Discharge: 2020-11-21 | Disposition: A | Discharge status: Home / Self Care | Payer: Federal, State, Local not specified - PPO | Attending: Pulmonary Disease | Admitting: Pulmonary Disease

## 2020-11-21 ENCOUNTER — Ambulatory Visit: Payer: Federal, State, Local not specified - PPO | Admitting: Pulmonary Disease

## 2020-11-21 VITALS — BP 140/80 | HR 88 | Temp 97.1°F | Ht 68.0 in | Wt 241.8 lb

## 2020-11-21 DIAGNOSIS — E669 Obesity, unspecified: Secondary | ICD-10-CM | POA: Diagnosis not present

## 2020-11-21 DIAGNOSIS — J45909 Unspecified asthma, uncomplicated: Secondary | ICD-10-CM | POA: Diagnosis present

## 2020-11-21 DIAGNOSIS — Z9989 Dependence on other enabling machines and devices: Secondary | ICD-10-CM

## 2020-11-21 DIAGNOSIS — R0602 Shortness of breath: Secondary | ICD-10-CM

## 2020-11-21 DIAGNOSIS — G4733 Obstructive sleep apnea (adult) (pediatric): Secondary | ICD-10-CM

## 2020-11-21 LAB — CBC WITH DIFFERENTIAL/PLATELET
Abs Immature Granulocytes: 0.01 10*3/uL (ref 0.00–0.07)
Basophils Absolute: 0.1 10*3/uL (ref 0.0–0.1)
Basophils Relative: 1 %
Eosinophils Absolute: 0.8 10*3/uL — ABNORMAL HIGH (ref 0.0–0.5)
Eosinophils Relative: 12 %
HCT: 46.5 % (ref 39.0–52.0)
Hemoglobin: 16.2 g/dL (ref 13.0–17.0)
Immature Granulocytes: 0 %
Lymphocytes Relative: 30 %
Lymphs Abs: 2.1 10*3/uL (ref 0.7–4.0)
MCH: 33.1 pg (ref 26.0–34.0)
MCHC: 34.8 g/dL (ref 30.0–36.0)
MCV: 94.9 fL (ref 80.0–100.0)
Monocytes Absolute: 0.5 10*3/uL (ref 0.1–1.0)
Monocytes Relative: 8 %
Neutro Abs: 3.6 10*3/uL (ref 1.7–7.7)
Neutrophils Relative %: 49 %
Platelets: 221 10*3/uL (ref 150–400)
RBC: 4.9 MIL/uL (ref 4.22–5.81)
RDW: 13.3 % (ref 11.5–15.5)
WBC: 7.1 10*3/uL (ref 4.0–10.5)
nRBC: 0 % (ref 0.0–0.2)

## 2020-11-21 MED ORDER — ALBUTEROL SULFATE HFA 108 (90 BASE) MCG/ACT IN AERS: 2.0000 | INHALATION_SPRAY | Freq: Four times a day (QID) | RESPIRATORY_TRACT | 2 refills | Status: AC | PRN

## 2020-11-21 MED ORDER — BREO ELLIPTA 100-25 MCG/INH IN AEPB: 1.0000 | INHALATION_SPRAY | Freq: Every day | RESPIRATORY_TRACT | 0 refills | Status: AC

## 2020-11-21 NOTE — Progress Notes (Signed)
Subjective:    Patient ID: Eric Johns, male    DOB: July 25, 1956, 65 y.o.   MRN: [id number]  HPI Eric Johns is 65 year old lifelong never smoker who presents for evaluation of shortness of breath with physical activity, cough and congestion.  He is kindly referred by Essie Hart.  The patient states that symptoms started 2 to 3 months after obtaining 2 cats in the home.  He notes that nighttime cold medications help him at nighttime.  Also laying prone makes the symptoms worse.  He has not had any fevers, chills or sweats.  Cough is productive of whitish sputum.  No hemoptysis.  He has had a vague left-sided chest pain that has been evaluated by cardiology and deemed not to be cardiac.  He does not endorse any gastroesophageal reflux symptoms.  He has not used inhalers or any other medication to relieve his shortness of breath.  He had a stress echo performed 08 August 2020 that was normal.  Chest x-ray PA and lateral performed 24 January was noted to have no change from prior chest x-rays most notably from November 2021.  However I only have reports of these tests as they were performed at [facility].  Looking back on his care everywhere chart, it looks like he has been evaluated for chest pain and shortness of breath as far back as 2015.  He has had a history of eosinophilia.  The patient does not describe having allergy evaluation previously though he has an upcoming allergy evaluation at [facility].  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  He has also worked in war zones in third world countries but he is unable to provide details of  that.   Review of Systems A 10 point review of systems was performed and it is as noted above otherwise negative.  Past Medical History:  Diagnosis Date  . Gout   . Hypogonadism in male   . Mixed hyperlipidemia   . NAFL (nonalcoholic fatty liver)   . Obesity   . OSA on CPAP 06/13/2014   Past Surgical History:  Procedure Laterality Date  . APPENDECTOMY  1972   Patient Active Problem List   Diagnosis Date Noted  . Circadian rhythm sleep disorder, jet lag type 05/06/2017  . Hypogonadism in male 11/29/2016  . Class 1 obesity due to excess calories without serious comorbidity with body mass index (BMI) of 34.0 to 34.9 in adult 11/29/2016  . Fatty liver disease, nonalcoholic 11/09/2016  . Chronic idiopathic gout involving toe of right foot without tophus 11/09/2016  . Abnormal LFTs 08/08/2015  . Eosinophil count raised 05/09/2015  . OSA on CPAP 06/13/2014   Family History  Problem Relation Age of Onset  . Diverticulitis Father   . Diabetes Maternal Grandfather   . Emphysema Maternal Grandfather    Social History   Tobacco Use  . Smoking status: Never Smoker  . Smokeless tobacco: Former Neurosurgeon    Types: Snuff  Substance Use Topics  . Alcohol use: Yes    Alcohol/week: 1.0 - 2.0 standard drink    Types: 1 - 2 Glasses of wine per week   No  Known Allergies Current Meds  Medication Sig  . albuterol (VENTOLIN HFA) 108 (90 Base) MCG/ACT inhaler Inhale 2 puffs into the lungs every 6 (six) hours as needed for wheezing or shortness of breath.  . allopurinol (ZYLOPRIM) 100 MG tablet TAKE 1 TABLET BY MOUTH TWICE A DAY  . aspirin 81 MG tablet Take 81 mg by mouth daily.  . colchicine 0.6 MG tablet Take 1 tablet (0.6 mg total) by mouth 2 (two) times daily as needed.  . diclofenac sodium (VOLTAREN) 1 % GEL Apply 2 g topically 4 (four) times daily.  . fluticasone furoate-vilanterol (BREO ELLIPTA) 100-25 MCG/INH AEPB Inhale 1 puff into the lungs daily for 1 day.  . Melatonin 3 MG TABS  Take 3 mg by mouth at bedtime.   . Multiple Vitamins-Minerals (MULTIVITAMIN PO) Take by mouth. Taking daily ( [employer] pack)  . triamcinolone (NASACORT) 55 MCG/ACT AERO nasal inhaler Place into the nose.   Immunization History  Administered Date(s) Administered  . Influenza Inj Mdck Quad Pf 11/16/2018  . Influenza-Unspecified 09/01/2020  . PFIZER(Purple Top)SARS-COV-2 Vaccination 10/20/2019, 11/10/2019, 09/01/2020  . Pneumococcal Conjugate-13 04/17/2020  . Td 06/23/1984       Objective:   Physical Exam BP 140/80 (BP Location: Left Arm, Cuff Size: Normal)   Pulse 88   Temp (!) 97.1 F (36.2 C) (Temporal)   Ht 5\' 8"  (1.727 m)   Wt 241 lb 12.8 oz (109.7 kg)   SpO2 98%   BMI 36.77 kg/m  GENERAL: Well-developed, obese, no acute distress.  Fully ambulatory.  No conversational dyspnea. HEAD: Normocephalic, atraumatic.  EYES: Pupils equal, round, reactive to light.  No scleral icterus.  MOUTH: Nose/mouth/throat not examined due to masking requirements for COVID 19. NECK: Supple. No thyromegaly. Trachea midline. No JVD.  No adenopathy. PULMONARY: Good air entry bilaterally.  Coarse, otherwise no adventitious sounds. CARDIOVASCULAR: S1 and S2. Regular rate and rhythm.  No rubs, murmurs or gallops heard. ABDOMEN: Protuberant, benign. MUSCULOSKELETAL: No joint deformity, no clubbing, no edema.  NEUROLOGIC: No focal deficit, no gait disturbance, speech is fluent. SKIN: Intact,warm,dry.  On limited exam, no rashes PSYCH: Mood and behavior normal.       Assessment & Plan:     ICD-10-CM   1. Persistent asthma without complication, unspecified asthma severity  J45.909 Pulmonary Function Test [facility] Only    fluticasone furoate-vilanterol (BREO ELLIPTA) 100-25 MCG/INH AEPB    Allergen Panel (27) + IGE    CBC w/Diff    CANCELED: CBC w/Diff    CANCELED: Allergen Panel (27) + IGE   Suspect asthma Patient to have allergy evaluation CBC allergen panel Trial of Breo Ellipta 100/25 PFTs  2.  Shortness of breath  R06.02    Likely related to poorly controlled asthma Obesity may be aggravating as well  3. Obesity (BMI 35.0-39.9 without comorbidity)  E66.9    This issue adds complexity to his management Weight loss is encouraged  4. OSA on CPAP  G47.33    Z99.89    Does not endorse any issues with his CPAP Followed by Dr. 08-23-1990, neurology   Orders Placed This Encounter  Procedures  . Allergen Panel (27) + IGE    Standing Status:   Future    Number of Occurrences:   1    Standing Expiration Date:   11/21/2021  . CBC w/Diff    Standing Status:   Future    Number of Occurrences:   1    Standing Expiration Date:   11/21/2021  .  Pulmonary Function Test [facility] Only    Standing Status:   Future    Standing Expiration Date:   11/21/2021    Scheduling Instructions:     4 weeks    Order Specific Question:   Full PFT: includes the following: basic spirometry, spirometry pre & post bronchodilator, diffusion capacity (DLCO), lung volumes    Answer:   Full PFT   Meds ordered this encounter  Medications  . fluticasone furoate-vilanterol (BREO ELLIPTA) 100-25 MCG/INH AEPB    Sig: Inhale 1 puff into the lungs daily for 1 day.    Dispense:  14 each    Refill:  0    Order Specific Question:   Lot Number?    Answer:   [id number]    Order Specific Question:   Expiration Date?    Answer:   05/24/2021    Order Specific Question:   Manufacturer?    Answer:   Gl[employer]oSmithKline [[id number]]    Order Specific Question:   Quantity    Answer:   1  . albuterol (VENTOLIN HFA) 108 (90 Base) MCG/ACT inhaler    Sig: Inhale 2 puffs into the lungs every 6 (six) hours as needed for wheezing or shortness of breath.    Dispense:  8 g    Refill:  2   Discussion:  Patient appears to have issues with persistent asthma.  Unfortunately recently acquired to cats and has noted worsening symptoms since he acquired the pets.  He believes that he can "get a shot" to control allergy to cats.  I have made it very clear that the  first principle of allergy management is allergen avoidance.  We will check a CBC with differential as he has had issues with eosinophilia in the past and check a RAST panel.  We'll give him a trial of Breo Ellipta 1 inhalation daily.  He was instructed on the proper use of the DPI as well as proper rinsing afterwards.  He is to let us know if this is efficacious so that we can write a prescription for him.  In addition he will be given albuterol as needed as rescue.  PFTs have been ordered.  We'll see the patient in follow-up in 6 weeks time he is to contact us sooner should any new problems arise.  Gailen Shelter, MD [facility]   *This note was dictated using voice recognition software/Dragon.  Despite best efforts to proofread, errors can occur which can change the meaning.  Any change was purely unintentional.

## 2020-11-21 NOTE — Patient Instructions (Signed)
I suspect you have asthma.  We are going to give you a trial of albuterol which is an inhaler that you can carry with you in case you have issues with shortness of breath or cough that are uncontrollable this is a "emergency" inhaler.  Giving you a trial of Breo Ellipta 1 inhalation daily, let us know how you do with the inhaler.  This is a medication that you take every day as maintenance.  Make sure you rinse your mouth well after you use it.  Let us know how you do with the inhaler so we can call the prescription in for you.  We are scheduling breathing tests and blood tests, this will give me more of an idea of the severity of your asthma.  I suspect that the triggers for your issues may be cats in the home.  Unfortunately cats are not hypoallergenic.  We will see you in follow-up in 4 to 6 weeks time call sooner should any new problems arise.  Please do make sure you call us about the Earlie Server to let us know if this is working for you.

## 2020-11-24 ENCOUNTER — Telehealth: Payer: Self-pay

## 2020-11-24 LAB — ALLERGEN PANEL (27) + IGE
Alternaria Alternata IgE: 0.24 kU/L — AB
Aspergillus Fumigatus IgE: <0.1 kU/L
Bahia Grass IgE: <0.1 kU/L
Bermuda Grass IgE: 0.18 kU/L — AB
Cat Dander IgE: 12.2 kU/L — AB
Cedar, Mountain IgE: <0.1 kU/L
Cladosporium Herbarum IgE: <0.1 kU/L
Cocklebur IgE: <0.1 kU/L
Cockroach, American IgE: <0.1 kU/L
Common Silver Birch IgE: 0.22 kU/L — AB
D Farinae IgE: <0.1 kU/L
D Pteronyssinus IgE: <0.1 kU/L
Dog Dander IgE: 0.6 kU/L — AB
Elm, American IgE: <0.1 kU/L
Hickory, White IgE: 0.19 kU/L — AB
IgE (Immunoglobulin E), Serum: 143 IU/mL (ref 6–495)
Johnson Grass IgE: <0.1 kU/L
Kentucky Bluegrass IgE: 0.12 kU/L — AB
Maple/Box Elder IgE: <0.1 kU/L
Mucor Racemosus IgE: <0.1 kU/L
Oak, White IgE: 0.18 kU/L — AB
Penicillium Chrysogen IgE: <0.1 kU/L
Pigweed, Rough IgE: <0.1 kU/L
Plantain, English IgE: <0.1 kU/L
Ragweed, Short IgE: <0.1 kU/L
Setomelanomma Rostrat: <0.1 kU/L
Timothy Grass IgE: <0.1 kU/L
White Mulberry IgE: <0.1 kU/L

## 2020-11-24 MED ORDER — BREO ELLIPTA 100-25 MCG/INH IN AEPB: 1.0000 | INHALATION_SPRAY | Freq: Every day | RESPIRATORY_TRACT | 11 refills | Status: AC

## 2020-11-24 NOTE — Telephone Encounter (Signed)
Patient is aware of date/time of covid test prior to PFT.   Rx for Breo 100 has bene sent to preferred pharmacy per patient request.  Patient feels that Virgel Bouquet is effective.

## 2020-11-29 ENCOUNTER — Other Ambulatory Visit
Admission: RE | Admit: 2020-11-29 | Discharge: 2020-11-29 | Disposition: A | Discharge status: Home / Self Care | Payer: Federal, State, Local not specified - PPO | Source: Ambulatory Visit | Attending: Pulmonary Disease | Admitting: Pulmonary Disease

## 2020-11-29 DIAGNOSIS — Z01812 Encounter for preprocedural laboratory examination: Secondary | ICD-10-CM | POA: Insufficient documentation

## 2020-11-29 DIAGNOSIS — Z20822 Contact with and (suspected) exposure to covid-19: Secondary | ICD-10-CM | POA: Insufficient documentation

## 2020-11-29 LAB — SARS CORONAVIRUS 2 (TAT 6-24 HRS): SARS Coronavirus 2: NEGATIVE

## 2020-11-30 ENCOUNTER — Other Ambulatory Visit: Payer: Self-pay

## 2020-11-30 ENCOUNTER — Ambulatory Visit: Payer: Federal, State, Local not specified - PPO

## 2020-11-30 ENCOUNTER — Ambulatory Visit: Payer: Federal, State, Local not specified - PPO | Attending: Pulmonary Disease

## 2020-11-30 DIAGNOSIS — J45909 Unspecified asthma, uncomplicated: Secondary | ICD-10-CM | POA: Diagnosis present

## 2020-11-30 LAB — PULMONARY FUNCTION TEST ARMC ONLY
DL/VA % pred: 115 %
DL/VA: 4.82 ml/min/mmHg/L
DLCO unc % pred: 118 %
DLCO unc: 29.85 ml/min/mmHg
FEF 25-75 Post: 3.76 L/sec
FEF 25-75 Pre: 4.46 L/sec
FEF2575-%Change-Post: -15 %
FEF2575-%Pred-Post: 147 %
FEF2575-%Pred-Pre: 175 %
FEV1-%Change-Post: 0 %
FEV1-%Pred-Post: 102 %
FEV1-%Pred-Pre: 102 %
FEV1-Post: 3.27 L
FEV1-Pre: 3.27 L
FEV1FVC-%Change-Post: 4 %
FEV1FVC-%Pred-Pre: 112 %
FEV6-%Change-Post: -4 %
FEV6-%Pred-Post: 92 %
FEV6-%Pred-Pre: 96 %
FEV6-Post: 3.73 L
FEV6-Pre: 3.89 L
FEV6FVC-%Pred-Post: 105 %
FEV6FVC-%Pred-Pre: 105 %
FVC-%Change-Post: -4 %
FVC-%Pred-Post: 87 %
FVC-%Pred-Pre: 91 %
FVC-Post: 3.73 L
Post FEV1/FVC ratio: 88 %
Post FEV6/FVC ratio: 100 %
Pre FEV1/FVC ratio: 84 %
Pre FEV6/FVC Ratio: 100 %
RV % pred: 110 %
RV: 2.46 L
TLC % pred: 95 %
TLC: 6.31 L

## 2021-05-14 ENCOUNTER — Encounter: Payer: Self-pay | Admitting: Family Medicine

## 2021-05-14 ENCOUNTER — Ambulatory Visit: Payer: Federal, State, Local not specified - PPO | Admitting: Family Medicine

## 2021-05-14 ENCOUNTER — Other Ambulatory Visit: Payer: Self-pay

## 2021-05-14 VITALS — BP 132/80 | HR 76 | Ht 68.0 in | Wt 227.0 lb

## 2021-05-14 DIAGNOSIS — G4733 Obstructive sleep apnea (adult) (pediatric): Secondary | ICD-10-CM | POA: Diagnosis not present

## 2021-05-14 DIAGNOSIS — Z9989 Dependence on other enabling machines and devices: Secondary | ICD-10-CM | POA: Diagnosis not present

## 2021-05-14 NOTE — Progress Notes (Signed)
PATIENT: Eric Johns DOB: Johns 21, 1957  REASON FOR VISIT: follow up HISTORY FROM: patient  Chief Complaint  Patient presents with   Follow-up    New room, alone. Last seen 05/11/20.  Has lost about 14 lb on phentermine (Started this about 4-5 weeks ago). Using machine 4 hr or more each night.       HISTORY OF PRESENT ILLNESS: 05/14/21 ALL: Eric Johns returns for follow up for OSA on CPAP. He continues to do well on CPAP. His current machine is about 66 years old. No concerns or difficulty with machine or supplies. He is working on healthy lifestyle habits and weight management. Recently restarted on phentermine.   Compliance report dated 04/14/2021-05/13/2021 shows that he used CPAP 30/30 days for greater than 4 hours. He used CPAP about 7 hours on average. Residual AHI 3.6 on 5-12cmH20.   05/11/2020 ALL:  Eric Johns is a 65 y.o. male here today for follow up for OSA on CPAP.  He reports that he is doing well with CPAP therapy at home.  He continues to use CPAP nightly.  He denies any concerns with his machine.  He continues to be the primary caregiver for his mother-in-law.  Compliance report dated 04/08/2020 through 05/08/2020 reveals that he used CPAP 30 of the past 30 days for compliance of 100%.  He used CPAP greater than 4 hours all 30 days for compliance of 100%.  Residual AHI was 4.4 on 6 to 12 cm of water and an EPR of 3.  There was a leak noted in the 95th percentile of 22 L/min.  He states that leak is due to sleep positioning.  He is feeling well and without complaints today.  HISTORY: (copied from my note on 05/11/2019)  Eric Johns is a 65 y.o. male here today for follow up of OSA on CPAP.  He reports doing very well with therapy.  He states that he does have 2 separate CPAP machines at home.  One is an older machine that was originally his father in-law's.  He has been caring for his ill mother-in-law.  He uses his machine when he is at home.  He uses his father in  law's machine when he is caring for his mother-in-law.  Compliance report dated 04/09/2019 through 05/08/2019 reveals that he has used CPAP 15 of the last 30 days.  15 of those days greater than 4 hours.  Average usage was 6 hours and 44 minutes.  AHI was well managed at 3.7 on 6 to 12 cm of water and an EPR of 3.  There was a mild leak noted at 24.7.  He states that he will attempt to locate card for additional machine.  He reports that he is using CPAP therapy every night.  He does travel often and reports taking his CPAP machine with him when he travels.  He notes significant benefit with using CPAP therapy.   HISTORY: (copied from [place] note on 05/07/2018)   Eric Johns is a 65 year old male with a history of obstructive sleep apnea on CPAP.  He returns today for follow-up.  His CPAP download indicates that he use his machine nightly for compliance of 100%.  He uses machine greater than 4 hours each night.  On average he uses his machine 7 hours and 16 minutes.  His residual AHI is 4.4 on 6 to 12 cm of water with EPR 3.  He reports that the CPAP continues to work well for him.  He reports that he takes it everywhere he goes.  He denies any new neurological symptoms.  He returns today for evaluation.     HISTORY 05/06/2017, Eric Johns is a compliant CPAP user who has used a CPAP for the last 30 days it 100%, his AHI is 4.9, his using an AutoSet between 4 and 10 cm water with 3 cm expiratory pressure relief the pressure at the 95th percentile is 9.7 cm. There is room to increase the pressure a little bit more. I'm very happy with his compliance time an average of 6 hours and 16 minutes each night. He may need 12 cm water. He needs to use a travel friendly machine.  He worked in [place] last week, he worked in [place] the month before. He will buy a travel CPAP outright.     REVIEW OF SYSTEMS: Out of a complete 14 system review of symptoms, the patient complains only of the following symptoms, none  and all other reviewed systems are negative.  ESS: 2  ALLERGIES: No Known Allergies  HOME MEDICATIONS: Outpatient Medications Prior to Visit  Medication Sig Dispense Refill   albuterol (VENTOLIN HFA) 108 (90 Base) MCG/ACT inhaler Inhale 2 puffs into the lungs every 6 (six) hours as needed for wheezing or shortness of breath. 8 g 2   allopurinol (ZYLOPRIM) 100 MG tablet TAKE 1 TABLET BY MOUTH TWICE A DAY 90 tablet 0   Apoaequorin (PREVAGEN PO) Take by mouth.     aspirin 81 MG tablet Take 81 mg by mouth daily.     benzonatate (TESSALON) 100 MG capsule Take 100-200 mg by mouth 3 (three) times daily as needed.     BREO ELLIPTA 100-25 MCG/INH AEPB Inhale 1 puff into the lungs daily.     colchicine 0.6 MG tablet Take 1 tablet (0.6 mg total) by mouth 2 (two) times daily as needed. 60 tablet 3   diclofenac sodium (VOLTAREN) 1 % GEL Apply 2 g topically 4 (four) times daily. 100 g 0   EPINEPHrine 0.3 mg/0.3 mL IJ SOAJ injection as needed.     ibuprofen (ADVIL) 200 MG tablet Take 400 mg by mouth at bedtime.     Melatonin 3 MG TABS Take 3 mg by mouth at bedtime.      meloxicam (MOBIC) 15 MG tablet Take 15 mg by mouth daily.     montelukast (SINGULAIR) 10 MG tablet Take 10 mg by mouth daily.     Multiple Vitamins-Minerals (MULTIVITAMIN PO) Take by mouth. Taking daily ( [employer]NC pack)     phentermine 15 MG capsule Take 15 mg by mouth every morning.     sildenafil (REVATIO) 20 MG tablet Take 20-60 mg by mouth as needed.     triamcinolone (NASACORT) 55 MCG/ACT AERO nasal inhaler Place into the nose.     UNABLE TO FIND Med Name: Allergy injections     XARELTO 20 MG TABS tablet Take 20 mg by mouth at bedtime.     metoprolol succinate (TOPROL-XL) 25 MG 24 hr tablet Take 25 mg by mouth daily.      methocarbamol (ROBAXIN) 500 MG tablet Take 500-1,000 mg by mouth 4 (four) times daily as needed.     No facility-administered medications prior to visit.    PAST MEDICAL HISTORY: Past Medical History:   Diagnosis Date   Gout    Hypogonadism in male    Mixed hyperlipidemia    NAFL (nonalcoholic fatty liver)    Obesity    OSA on CPAP 06/13/2014  PAST SURGICAL HISTORY: Past Surgical History:  Procedure Laterality Date   APPENDECTOMY  1972    FAMILY HISTORY: Family History  Problem Relation Age of Onset   Diverticulitis Father    Diabetes Maternal Grandfather    Emphysema Maternal Grandfather     SOCIAL HISTORY: Social History   Socioeconomic History   Marital status: Married    Spouse name: [name]   Number of children: 3   Years of education: College   Highest education level: Not on file  Occupational History   Occupation: Animal nutritionist    Comment: [employer]  Tobacco Use   Smoking status: Never   Smokeless tobacco: Former    Types: Snuff    Quit date: 08/24/2007  Substance and Sexual Activity   Alcohol use: Yes    Alcohol/week: 1.0 - 2.0 standard drink    Types: 1 - 2 Glasses of wine per week   Drug use: No   Sexual activity: Not Currently  Other Topics Concern   Not on file  Social History Narrative   Patient is married [name]). He and his wife live at different locations.    Patient is retired and working part-time.    Patient is right-handed.   Patient has three children and his wife has two children.   Social Determinants of Health   Financial Resource Strain: Not on file  Food Insecurity: Not on file  Transportation Needs: Not on file  Physical Activity: Not on file  Stress: Not on file  Social Connections: Not on file  Intimate Partner Violence: Not on file      PHYSICAL EXAM  Vitals:   05/14/21 1447  BP: 132/80  Pulse: 76  Weight: 227 lb (103 kg)  Height: 5\' 8"  (1.727 m)    Body mass index is 34.52 kg/m.  Generalized: Well developed, in no acute distress  Cardiology: normal rate and rhythm, no murmur noted Respiratory: clear to auscultation bilaterally  Neurological examination  Mentation: Alert  oriented to time, place, history taking. Follows all commands speech and language fluent Cranial nerve II-XII: Pupils were equal round reactive to light. Extraocular movements were full, visual field were full  Motor: The motor testing reveals 5 over 5 strength of all 4 extremities. Good symmetric motor tone is noted throughout.  Gait and station: Gait is normal.   DIAGNOSTIC DATA (LABS, IMAGING, TESTING) - I reviewed patient records, labs, notes, testing and imaging myself where available.  MMSE - Mini Mental State Exam 03/06/2018 03/06/2018  Orientation to time 5 5  Orientation to Place 5 5  Registration 3 3  Attention/ Calculation 5 5  Recall 3 3  Language- name 2 objects 2 2  Language- repeat 1 1  Language- follow 3 step command 3 3  Language- read & follow direction 1 1  Write a sentence 1 1  Copy design 1 1  Total score 30 30     Lab Results  Component Value Date   WBC 7.1 11/21/2020   HGB 16.2 11/21/2020   HCT 46.5 11/21/2020   MCV 94.9 11/21/2020   PLT 221 11/21/2020      Component Value Date/Time   NA 139 03/06/2018 1030   K 4.7 03/06/2018 1030   CL 103 03/06/2018 1030   CO2 21 03/06/2018 1030   GLUCOSE 94 03/06/2018 1030   BUN 17 03/06/2018 1030   CREATININE 1.33 (H) 03/06/2018 1030   CALCIUM 10.2 03/06/2018 1030   PROT 7.8 03/06/2018 1030   ALBUMIN 5.1 (  H) 03/06/2018 1030   AST 52 (H) 03/06/2018 1030   ALT 60 (H) 03/06/2018 1030   ALKPHOS 97 03/06/2018 1030   BILITOT 0.5 03/06/2018 1030   GFRNONAA 57 (L) 03/06/2018 1030   GFRAA 66 03/06/2018 1030   Lab Results  Component Value Date   CHOL 252 (H) 03/06/2018   HDL 61 03/06/2018   LDLCALC 145 (H) 03/06/2018   TRIG 232 (H) 03/06/2018   CHOLHDL 4.1 03/06/2018   Lab Results  Component Value Date   HGBA1C 5.5 11/09/2016   No results found for: VITAMINB12 Lab Results  Component Value Date   TSH 3.740 03/06/2018     ASSESSMENT AND PLAN 65 y.o. year old male  has a past medical history of Gout,  Hypogonadism in male, Mixed hyperlipidemia, NAFL (nonalcoholic fatty liver), Obesity, and OSA on CPAP (06/13/2014). here with     ICD-10-CM   1. OSA on CPAP  G47.33    Z99.89        Eric NovemberMike continues to do well on CPAP therapy.  Compliance report reveals excellent compliance.  Leak continues to be noted on compliance report, however, residual AHI is normal.  He is comfortable with his current mask.  He was encouraged to continue using CPAP nightly and for greater than 4 hours each night.  He will continue to work on healthy lifestyle habits.  He will monitor blood pressures at home.  He will follow-up with us annually, sooner if needed.  He verbalizes understanding and agreement with this plan.   No orders of the defined types were placed in this encounter.    No orders of the defined types were placed in this encounter.      Shawnie Dappermy Triston Lisanti, FNP-C 05/14/2021, 3:05 PM [facility] [address], [address] [place], [place] [address] [phone]

## 2021-05-14 NOTE — Patient Instructions (Addendum)
Please continue using your CPAP regularly. While your insurance requires that you use CPAP at least 4 hours each night on 70% of the nights, I recommend, that you not skip any nights and use it throughout the night if you can. Getting used to CPAP and staying with the treatment long term does take time and patience and discipline. Untreated obstructive sleep apnea when it is moderate to severe can have an adverse impact on cardiovascular health and raise her risk for heart disease, arrhythmias, hypertension, congestive heart failure, stroke and diabetes. Untreated obstructive sleep apnea causes sleep disruption, nonrestorative sleep, and sleep deprivation. This can have an impact on your day to day functioning and cause daytime sleepiness and impairment of cognitive function, memory loss, mood disturbance, and problems focussing. Using CPAP regularly can improve these symptoms.  Continue working on weight loss efforts. Consider talking with PCP or nutrition regarding a low carbohydrate diet.   Follow up in 1 year, sooner if you wish

## 2021-05-15 NOTE — Progress Notes (Signed)
CM sent to [facility] 

## 2021-06-18 ENCOUNTER — Telehealth: Payer: Self-pay | Admitting: Pulmonary Disease

## 2021-06-18 NOTE — Telephone Encounter (Signed)
Received alternative request from [facility] for Breo. Cover alternatives are Symbicort, Wixela, Advair Diskus or fluticasone-salmeterol 250.  Dr. Jayme Cloud, please advise. Thanks

## 2021-06-20 NOTE — Telephone Encounter (Signed)
Lets try Symbicort 160/4.5, 2 puffs twice a day.  Make sure he rinses his mouth well after use.

## 2021-06-21 NOTE — Telephone Encounter (Signed)
Lm for patient.  

## 2021-06-22 MED ORDER — BUDESONIDE-FORMOTEROL FUMARATE 160-4.5 MCG/ACT IN AERO: 2.0000 | INHALATION_SPRAY | Freq: Two times a day (BID) | RESPIRATORY_TRACT | 12 refills | Status: AC

## 2021-06-22 NOTE — Telephone Encounter (Signed)
Patient is aware of below message and voiced his understanding.  He is agreeable with switch.  Rx has been sent to preferred pharmacy. Nothing further needed at this time.

## 2022-04-19 ENCOUNTER — Other Ambulatory Visit: Payer: Self-pay | Admitting: Physician Assistant

## 2022-04-19 DIAGNOSIS — I639 Cerebral infarction, unspecified: Secondary | ICD-10-CM

## 2022-04-23 ENCOUNTER — Other Ambulatory Visit: Payer: Federal, State, Local not specified - PPO

## 2022-04-30 ENCOUNTER — Ambulatory Visit
Admission: RE | Admit: 2022-04-30 | Discharge: 2022-04-30 | Disposition: A | Discharge status: Home / Self Care | Payer: Federal, State, Local not specified - PPO | Source: Ambulatory Visit | Attending: Physician Assistant | Admitting: Physician Assistant

## 2022-04-30 DIAGNOSIS — I639 Cerebral infarction, unspecified: Secondary | ICD-10-CM

## 2022-05-14 ENCOUNTER — Encounter: Payer: Self-pay | Admitting: Family Medicine

## 2022-05-14 ENCOUNTER — Ambulatory Visit: Payer: Federal, State, Local not specified - PPO | Admitting: Family Medicine

## 2022-05-14 VITALS — BP 133/84 | HR 94 | Ht 68.0 in | Wt 223.5 lb

## 2022-05-14 DIAGNOSIS — G4733 Obstructive sleep apnea (adult) (pediatric): Secondary | ICD-10-CM | POA: Diagnosis not present

## 2022-05-14 DIAGNOSIS — Z9989 Dependence on other enabling machines and devices: Secondary | ICD-10-CM | POA: Diagnosis not present

## 2022-05-14 NOTE — Patient Instructions (Addendum)
Please continue using your CPAP regularly. While your insurance requires that you use CPAP at least 4 hours each night on 70% of the nights, I recommend, that you not skip any nights and use it throughout the night if you can. Getting used to CPAP and staying with the treatment long term does take time and patience and discipline. Untreated obstructive sleep apnea when it is moderate to severe can have an adverse impact on cardiovascular health and raise her risk for heart disease, arrhythmias, hypertension, congestive heart failure, stroke and diabetes. Untreated obstructive sleep apnea causes sleep disruption, nonrestorative sleep, and sleep deprivation. This can have an impact on your day to day functioning and cause daytime sleepiness and impairment of cognitive function, memory loss, mood disturbance, and problems focussing. Using CPAP regularly can improve these symptoms.  We will order a repeat sleep study. Once we get results, we will order a new CPAP machine. Follow up with me in 31-90 days following set up of new CPAP.

## 2022-05-14 NOTE — Progress Notes (Signed)
PATIENT: Eric Johns DOB: 1956-07-03  REASON FOR VISIT: follow up HISTORY FROM: patient  Chief Complaint  Patient presents with   Obstructive Sleep Apnea    EMG rm 3, alone. Here for yearly CPAP f/u. Pt reports doing well on CPAP. Pt has been waking up early in the AM, soaked around his collar. Pt has been dealing w stress. Pt set up on 08/10/2014. Would like to get a new machine.     HISTORY OF PRESENT ILLNESS:  05/14/22 ALL: Eric Johns returns for follow up for OSA on CPAP. He is doing well from a sleep apnea standpoint. He reports that he is having a harder time sleeping, recently, due to more stressors. His father had a brain bleed recently. Mother in law has dementia. About 6 months ago, he had a sudden onset of right sided numbness/weakness. MRI unremarkable. 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   He reports CPAP machine set up in 2015. He is ready to get a new machine.     05/14/2021 ALL: Eric Johns returns for follow up for OSA on CPAP. He continues to do well on CPAP. His current machine is about 67 years old. No concerns or difficulty with machine or supplies. He is working on healthy lifestyle habits and weight management. Recently restarted on phentermine.   Compliance report dated 04/14/2021-05/13/2021 shows that he used CPAP 30/30 days for greater than 4 hours. He used CPAP about 7 hours on average. Residual AHI 3.6 on 5-12cmH20.   05/11/2020 ALL:  Eric InaMichael D Johns is a 66 y.o. male here today for follow up for OSA on CPAP.  He reports that he is doing well with CPAP therapy at home.  He continues to use CPAP nightly.  He denies any concerns with his machine.  He continues to be the primary caregiver for his mother-in-law.  Compliance report dated 04/08/2020 through 05/08/2020 reveals that he used CPAP 30 of the past 30 days for compliance of 100%.  He used CPAP greater than 4 hours all 30 days for compliance of 100%.  Residual AHI was 4.4 on 6 to 12 cm of  water and an EPR of 3.  There was a leak noted in the 95th percentile of 22 L/min.  He states that leak is due to sleep positioning.  He is feeling well and without complaints today.  HISTORY: (copied from my note on 05/11/2019)  Eric Johns is a 66 y.o. male here today for follow up of OSA on CPAP.  He reports doing very well with therapy.  He states that he does have 2 separate CPAP machines at home.  One is an older machine that was originally his father in-law's.  He has been caring for his ill mother-in-law.  He uses his machine when he is at home.  He uses his father in law's machine when he is caring for his mother-in-law.  Compliance report dated 04/09/2019 through 05/08/2019 reveals that he has used CPAP 15 of the last 30 days.  15 of those days greater than 4 hours.  Average usage was 6 hours and 44 minutes.  AHI was well managed at 3.7 on 6 to 12 cm of water and an EPR of 3.  There was a mild leak noted at 24.7.  He states that he will attempt to locate card for additional machine.  He reports that he is using CPAP therapy every night.  He does travel often and reports taking his CPAP machine  with him when he travels.  He notes significant benefit with using CPAP therapy.   HISTORY: (copied from [place] note on 05/07/2018)   Eric Johns is a 66 year old male with a history of obstructive sleep apnea on CPAP.  He returns today for follow-up.  His CPAP download indicates that he use his machine nightly for compliance of 100%.  He uses machine greater than 4 hours each night.  On average he uses his machine 7 hours and 16 minutes.  His residual AHI is 4.4 on 6 to 12 cm of water with EPR 3.  He reports that the CPAP continues to work well for him.  He reports that he takes it everywhere he goes.  He denies any new neurological symptoms. He returns today for evaluation.     HISTORY 05/06/2017, Eric Johns is a compliant CPAP user who has used a CPAP for the last 30 days it 100%, his AHI is  4.9, his using an AutoSet between 4 and 10 cm water with 3 cm expiratory pressure relief the pressure at the 95th percentile is 9.7 cm. There is room to increase the pressure a little bit more. I'm very happy with his compliance time an average of 6 hours and 16 minutes each night. He may need 12 cm water. He needs to use a travel friendly machine.  He worked in [place] last week, he worked in [place] the month before. He will buy a travel CPAP outright.     REVIEW OF SYSTEMS: Out of a complete 14 system review of symptoms, the patient complains only of the following symptoms, none and all other reviewed systems are negative.  ESS: 8/24, previously 2/24 FSS:45/63  Neck circ 16.75"  ALLERGIES: Allergies  Allergen Reactions   Cat Hair Extract Shortness Of Breath    SOB, watery eyes, itchy eyes   Other     Trees and hayes    HOME MEDICATIONS: Outpatient Medications Prior to Visit  Medication Sig Dispense Refill   albuterol (VENTOLIN HFA) 108 (90 Base) MCG/ACT inhaler Inhale 2 puffs into the lungs every 6 (six) hours as needed for wheezing or shortness of breath. 8 g 2   allopurinol (ZYLOPRIM) 100 MG tablet TAKE 1 TABLET BY MOUTH TWICE A DAY 90 tablet 0   Apoaequorin (PREVAGEN PO) Take by mouth.     budesonide-formoterol (SYMBICORT) 160-4.5 MCG/ACT inhaler Inhale 2 puffs into the lungs 2 (two) times daily. 1 each 12   colchicine 0.6 MG tablet Take 1 tablet (0.6 mg total) by mouth 2 (two) times daily as needed. 60 tablet 3   diclofenac sodium (VOLTAREN) 1 % GEL Apply 2 g topically 4 (four) times daily. 100 g 0   EPINEPHrine 0.3 mg/0.3 mL IJ SOAJ injection as needed.     ibuprofen (ADVIL) 200 MG tablet Take 400 mg by mouth at bedtime.     Melatonin 3 MG TABS Take 3 mg by mouth at bedtime.      meloxicam (MOBIC) 15 MG tablet Take 15 mg by mouth daily.     montelukast (SINGULAIR) 10 MG tablet Take 10 mg by mouth daily.     Multiple Vitamins-Minerals (MULTIVITAMIN PO) Take by mouth. Taking  daily ( [employer] pack)     phentermine 15 MG capsule Take 15 mg by mouth every morning.     sildenafil (REVATIO) 20 MG tablet Take 20-60 mg by mouth as needed.     triamcinolone (NASACORT ALLERGY 24HR) 55 MCG/ACT AERO nasal inhaler Place 1-2 sprays into  the nose daily.     triamcinolone (NASACORT) 55 MCG/ACT AERO nasal inhaler Place into the nose.     UNABLE TO FIND Med Name: Allergy injections     aspirin 81 MG tablet Take 81 mg by mouth daily.     benzonatate (TESSALON) 100 MG capsule Take 100-200 mg by mouth 3 (three) times daily as needed.     metoprolol succinate (TOPROL-XL) 25 MG 24 hr tablet Take 25 mg by mouth daily.      No facility-administered medications prior to visit.    PAST MEDICAL HISTORY: Past Medical History:  Diagnosis Date   Gout    Hypogonadism in male    Mixed hyperlipidemia    NAFL (nonalcoholic fatty liver)    Obesity    OSA on CPAP 06/13/2014    PAST SURGICAL HISTORY: Past Surgical History:  Procedure Laterality Date   APPENDECTOMY  1972    FAMILY HISTORY: Family History  Problem Relation Age of Onset   Diverticulitis Father    Diabetes Maternal Grandfather    Emphysema Maternal Grandfather     SOCIAL HISTORY: Social History   Socioeconomic History   Marital status: Married    Spouse name: [name]   Number of children: 3   Years of education: College   Highest education level: Not on file  Occupational History   Occupation: Animal nutritionist    Comment: [employer]  Tobacco Use   Smoking status: Never   Smokeless tobacco: Former    Types: Snuff    Quit date: 08/24/2007  Substance and Sexual Activity   Alcohol use: Yes    Alcohol/week: 1.0 - 2.0 standard drink of alcohol    Types: 1 - 2 Glasses of wine per week   Drug use: No   Sexual activity: Not Currently  Other Topics Concern   Not on file  Social History Narrative   Patient is married [name]). He and his wife live at different locations.    Patient is retired  and working part-time.    Patient is right-handed.   Patient has three children and his wife has two children.   Social Determinants of Health   Financial Resource Strain: Low Risk  (03/06/2018)   Overall Financial Resource Strain (CARDIA)    Difficulty of Paying Living Expenses: Not hard at all  Food Insecurity: No Food Insecurity (03/06/2018)   Hunger Vital Sign    Worried About Running Out of Food in the Last Year: Never true    Ran Out of Food in the Last Year: Never true  Transportation Needs: No Transportation Needs (03/06/2018)   PRAPARE - Administrator, Civil Service (Medical): No    Lack of Transportation (Non-Medical): No  Physical Activity: Inactive (03/06/2018)   Exercise Vital Sign    Days of Exercise per Week: 0 days    Minutes of Exercise per Session: 0 min  Stress: Stress Concern Present (03/06/2018)   [employer] of Occupational Health - Occupational Stress Questionnaire    Feeling of Stress : To some extent  Social Connections: Socially Integrated (03/06/2018)   Social Connection and Isolation Panel [NHANES]    Frequency of Communication with Friends and Family: More than three times a week    Frequency of Social Gatherings with Friends and Family: Never    Attends Religious Services: More than 4 times per year    Active Member of [employer] or Organizations: Yes    Attends Banker Meetings: More than 4 times per  year    Marital Status: Married  Catering manager Violence: Not At Risk (03/06/2018)   Humiliation, Afraid, Rape, and Kick questionnaire    Fear of Current or Ex-Partner: No    Emotionally Abused: No    Physically Abused: No    Sexually Abused: No      PHYSICAL EXAM  Vitals:   05/14/22 1436  BP: 133/84  Pulse: 94  Weight: 223 lb 8 oz (101.4 kg)  Height: 5\' 8"  (1.727 m)     Body mass index is 33.98 kg/m.  Generalized: Well developed, in no acute distress  Cardiology: normal rate and rhythm, no murmur  noted Respiratory: clear to auscultation bilaterally  Neurological examination  Mentation: Alert oriented to time, place, history taking. Follows all commands speech and language fluent Cranial nerve II-XII: Pupils were equal round reactive to light. Extraocular movements were full, visual field were full  Motor: The motor testing reveals 5 over 5 strength of all 4 extremities. Good symmetric motor tone is noted throughout.  Gait and station: Gait is normal.   DIAGNOSTIC DATA (LABS, IMAGING, TESTING) - I reviewed patient records, labs, notes, testing and imaging myself where available.     03/06/2018   10:36 AM 03/06/2018   10:03 AM  MMSE - Mini Mental State Exam  Orientation to time 5 5  Orientation to Place 5 5  Registration 3 3  Attention/ Calculation 5 5  Recall 3 3  Language- name 2 objects 2 2  Language- repeat 1 1  Language- follow 3 step command 3 3  Language- read & follow direction 1 1  Write a sentence 1 1  Copy design 1 1  Total score 30 30     Lab Results  Component Value Date   WBC 7.1 11/21/2020   HGB 16.2 11/21/2020   HCT 46.5 11/21/2020   MCV 94.9 11/21/2020   PLT 221 11/21/2020      Component Value Date/Time   NA 139 03/06/2018 1030   K 4.7 03/06/2018 1030   CL 103 03/06/2018 1030   CO2 21 03/06/2018 1030   GLUCOSE 94 03/06/2018 1030   BUN 17 03/06/2018 1030   CREATININE 1.33 (H) 03/06/2018 1030   CALCIUM 10.2 03/06/2018 1030   PROT 7.8 03/06/2018 1030   ALBUMIN 5.1 (H) 03/06/2018 1030   AST 52 (H) 03/06/2018 1030   ALT 60 (H) 03/06/2018 1030   ALKPHOS 97 03/06/2018 1030   BILITOT 0.5 03/06/2018 1030   GFRNONAA 57 (L) 03/06/2018 1030   GFRAA 66 03/06/2018 1030   Lab Results  Component Value Date   CHOL 252 (H) 03/06/2018   HDL 61 03/06/2018   LDLCALC 145 (H) 03/06/2018   TRIG 232 (H) 03/06/2018   CHOLHDL 4.1 03/06/2018   Lab Results  Component Value Date   HGBA1C 5.5 11/09/2016   No results found for: "VITAMINB12" Lab Results   Component Value Date   TSH 3.740 03/06/2018     ASSESSMENT AND PLAN 66 y.o. year old male  has a past medical history of Gout, Hypogonadism in male, Mixed hyperlipidemia, NAFL (nonalcoholic fatty liver), Obesity, and OSA on CPAP (06/13/2014). here with     ICD-10-CM   1. OSA on CPAP  G47.33 Home sleep test   Z99.89 For home use only DME continuous positive airway pressure (CPAP)      06/15/2014 continues to do well on CPAP therapy.  Compliance report reveals excellent compliance. He was encouraged to continue using CPAP nightly and for greater than  4 hours each night. We will repeat HST and order new CPAP pending results. He will continue to work on healthy lifestyle habits. He will follow-up with 31-90 days following set up of new machine.  He verbalizes understanding and agreement with this plan.   Orders Placed This Encounter  Procedures   For home use only DME continuous positive airway pressure (CPAP)    Supplies    Order Specific Question:   Length of Need    Answer:   Lifetime    Order Specific Question:   Patient has OSA or probable OSA    Answer:   Yes    Order Specific Question:   Is the patient currently using CPAP in the home    Answer:   Yes    Order Specific Question:   Settings    Answer:   Autotitration    Order Specific Question:   CPAP supplies needed    Answer:   Mask, headgear, cushions, filters, heated tubing and water chamber   Home sleep test    Standing Status:   Future    Standing Expiration Date:   05/15/2023    Order Specific Question:   Where should this test be performed:    Answer:   [facility]      No orders of the defined types were placed in this encounter.     Shawnie Dapper, FNP-C 05/14/2022, 4:13 PM [facility] [address], [address] [place], [place] [address] [phone]

## 2022-05-15 NOTE — Progress Notes (Signed)
CM sent to [facility] for new order ?

## 2022-06-19 ENCOUNTER — Telehealth: Payer: Self-pay | Admitting: Neurology

## 2022-06-19 NOTE — Telephone Encounter (Signed)
Mail out- [employer] no Josem Kaufmann req spoke to Georgeanna Lea ref # [id number]   Mailed out on 06/20/22.

## 2022-06-20 ENCOUNTER — Ambulatory Visit: Payer: Federal, State, Local not specified - PPO | Admitting: Neurology

## 2022-06-20 DIAGNOSIS — G4733 Obstructive sleep apnea (adult) (pediatric): Secondary | ICD-10-CM | POA: Diagnosis not present

## 2022-07-02 NOTE — Progress Notes (Signed)
              [facility]. Osric Klopf" Male, 66 y.o., [birth year]   HOME SLEEP TEST REPORT ( by Watch PAT)   STUDY DATE:  07-02-2022   ORDERING CLINICIAN: Larey Seat, MD   Debbora Presto, NP  REFERRING CLINICIAN:  Reginia Forts, MD PRIMARY NEUROLOGY: [place] INFORMATION/HISTORY: 05-14-2022.AL 100% compliant CPAP user in need of a new machine, Baseline study obtained by HST.    Epworth sleepiness score: 8/24. FSS at 45/ 63 points.    BMI: 34 kg/m   Neck Circumference: 16.75'   FINDINGS:   Sleep Summary:   Total Recording Time (hours, min):  8 h and 2 m      Total Sleep Time (hours, min):   6 h 38 m              Percent REM (%):   16%                                     Respiratory Indices:   Calculated pAHI (per hour):    28.2/h                         REM pAHI:      29.8/h                                           NREM pAHI:     27.9/h                         Positional AHI:   supine AHI was 46.4/h and right lateral sleep AHI was 23.7/h                                                Oxygen Saturation Statistics:   O2 Saturation Range (%):     between a nadir at 81% and max. Of 98%, mean saturation was 92%.                                  O2 Saturation (minutes) <89%:   4.1 minutes        Pulse Rate Statistics:   Pulse Mean (bpm):   67          Pulse Range:     between 47 and 120 bpm            IMPRESSION:  This HST confirms the presence of moderate-severe obstructive sleep apnea , sleep fragmentation and snoring, with the highest AHI in supine position.    RECOMMENDATION: Autotitration CPAP device by ResMed with a pressure setting from 6 through 15 cm water, with 3 cm H20 EPR and interface of choice, heated humidification.    INTERPRETING PHYSICIAN:   Larey Seat, MD   Medical Director of [facility] at [facility].

## 2022-07-09 ENCOUNTER — Ambulatory Visit: Payer: BC Managed Care – PPO | Admitting: Urology

## 2022-07-09 ENCOUNTER — Other Ambulatory Visit: Payer: Self-pay | Admitting: Family Medicine

## 2022-07-09 DIAGNOSIS — G4733 Obstructive sleep apnea (adult) (pediatric): Secondary | ICD-10-CM

## 2022-07-09 NOTE — Procedures (Signed)
        [facility]. Eric Johns" Male, 66 y.o., 01-22-1956   HOME SLEEP TEST REPORT ( by Watch PAT)   STUDY DATE:  07-02-2022   ORDERING CLINICIAN: Larey Seat, MD   Debbora Presto, NP  REFERRING CLINICIAN:  Reginia Forts, MD PRIMARY NEUROLOGY: [place] INFORMATION/HISTORY: 05-14-2022.AL 100% compliant CPAP user in need of a new machine, Baseline study obtained by HST. Recent CVA work up.    Epworth sleepiness score: 8/24. FSS at 45/ 63 points.    BMI: 34 kg/m   Neck Circumference: 16.75'   FINDINGS:   Sleep Summary:   Total Recording Time (hours, min):  8 h and 2 m      Total Sleep Time (hours, min):   6 h 38 m              Percent REM (%):   16%                                     Respiratory Indices:   Calculated pAHI (per hour):    28.2/h                         REM pAHI:      29.8/h                                           NREM pAHI:     27.9/h                         Positional AHI:   supine AHI was 46.4/h and right lateral sleep AHI was 23.7/h                                                Oxygen Saturation Statistics:   O2 Saturation Range (%):     between a nadir at 81% and max. Of 98%, mean saturation was 92%.                                  O2 Saturation (minutes) <89%:   4.1 minutes        Pulse Rate Statistics:   Pulse Mean (bpm):   67          Pulse Range:     between 47 and 120 bpm            IMPRESSION:  This HST confirms the presence of moderate-severe obstructive sleep apnea , sleep fragmentation and snoring, with the highest AHI in supine position.    RECOMMENDATION: Autotitration CPAP device by ResMed with a pressure setting from 6 through 15 cm water, with 3 cm H20 EPR and interface of choice, heated humidification.    INTERPRETING PHYSICIAN:   Larey Seat, MD   Medical Director of [facility] at [facility].

## 2022-07-10 ENCOUNTER — Telehealth: Payer: Self-pay

## 2022-07-11 ENCOUNTER — Encounter: Payer: Self-pay | Admitting: Urology

## 2022-07-11 ENCOUNTER — Ambulatory Visit (INDEPENDENT_AMBULATORY_CARE_PROVIDER_SITE_OTHER): Payer: Federal, State, Local not specified - PPO | Admitting: Urology

## 2022-07-11 ENCOUNTER — Other Ambulatory Visit: Payer: Self-pay | Admitting: Family Medicine

## 2022-07-11 VITALS — BP 125/84 | HR 77 | Ht 68.0 in | Wt 223.0 lb

## 2022-07-11 DIAGNOSIS — N138 Other obstructive and reflux uropathy: Secondary | ICD-10-CM | POA: Diagnosis not present

## 2022-07-11 DIAGNOSIS — R972 Elevated prostate specific antigen [PSA]: Secondary | ICD-10-CM | POA: Diagnosis not present

## 2022-07-11 DIAGNOSIS — N401 Enlarged prostate with lower urinary tract symptoms: Secondary | ICD-10-CM

## 2022-07-11 DIAGNOSIS — R29898 Other symptoms and signs involving the musculoskeletal system: Secondary | ICD-10-CM

## 2022-07-11 DIAGNOSIS — N529 Male erectile dysfunction, unspecified: Secondary | ICD-10-CM

## 2022-07-11 LAB — URINALYSIS, ROUTINE W REFLEX MICROSCOPIC
Bilirubin, UA: NEGATIVE
Glucose, UA: NEGATIVE
Ketones, UA: NEGATIVE
Leukocytes,UA: NEGATIVE
Nitrite, UA: NEGATIVE
Protein,UA: NEGATIVE
RBC, UA: NEGATIVE
Specific Gravity, UA: 1.02 (ref 1.005–1.030)
Urobilinogen, Ur: 0.2 mg/dL (ref 0.2–1.0)
pH, UA: 6.5 (ref 5.0–7.5)

## 2022-07-11 LAB — BLADDER SCAN AMB NON-IMAGING: Scan Result: 61

## 2022-07-11 NOTE — Progress Notes (Signed)
Assessment: 1. BPH with obstruction/lower urinary tract symptoms   2. Rising PSA level   3. Organic impotence     Plan: I reviewed the patient records from his PCP including provider notes and lab results. The role of PSA testing in prostate cancer screening discussed.  His PSA remains <4 is currently within normal range. Recommend repeating PSA in late December 2023. Diagnosis and management of BPH with lower urinary tract symptoms discussed. Recommend increasing tamsulosin to 0.4 mg twice daily.  He will use his current supply. Return to office in 1 month for reevaluation.  Chief Complaint:  Chief Complaint  Patient presents with   Elevated PSA    History of Present Illness:  Eric Johns is a 66 y.o. male who is seen in consultation from Wardell Honour, MD for evaluation of a rising PSA and lower urinary tract symptoms.  PSA results: 9/23 3.94 7/21 2.95 4/20 2.23 2/18 2.4  No history of UTIs or prostatitis.  No family history of prostate cancer.  No prior prostate biopsy.  He has lower urinary tract symptoms including frequency, urgency, nocturia 1-2 times, postvoid dribbling, hesitancy, intermittent stream, and decreased force of stream.  His symptoms have been present for approximately 2 years.  No dysuria or gross hematuria.  He has been on tamsulosin 0.4 mg for approximately 1 year.  He reported some initial improvement in his urinary symptoms. IPSS = 19 today.  He has a history of erectile dysfunction.  He and his wife are not currently sexually active.  He has a prescription for sildenafil but has not used the medication recently.  No decrease in his libido.   Past Medical History:  Past Medical History:  Diagnosis Date   Gout    Hypogonadism in male    Mixed hyperlipidemia    NAFL (nonalcoholic fatty liver)    Obesity    OSA on CPAP 06/13/2014    Past Surgical History:  Past Surgical History:  Procedure Laterality Date   APPENDECTOMY  1972     Allergies:  Allergies  Allergen Reactions   Cat Hair Extract Shortness Of Breath    SOB, watery eyes, itchy eyes   Other     Trees and hayes    Family History:  Family History  Problem Relation Age of Onset   Diverticulitis Father    Diabetes Maternal Grandfather    Emphysema Maternal Grandfather     Social History:  Social History   Tobacco Use   Smoking status: Never   Smokeless tobacco: Former    Types: Snuff    Quit date: 08/24/2007  Substance Use Topics   Alcohol use: Yes    Alcohol/week: 1.0 - 2.0 standard drink of alcohol    Types: 1 - 2 Glasses of wine per week   Drug use: No    Review of symptoms:  Constitutional:  Negative for unexplained weight loss, night sweats, fever, chills ENT:  Negative for nose bleeds, sinus pain, painful swallowing CV:  Negative for chest pain, shortness of breath, exercise intolerance, palpitations, loss of consciousness Resp:  Negative for cough, wheezing, shortness of breath GI:  Negative for nausea, vomiting, diarrhea, bloody stools GU:  Positives noted in HPI; otherwise negative for gross hematuria, dysuria Neuro:  Negative for seizures, poor balance, limb weakness, slurred speech Psych:  Negative for lack of energy, depression, anxiety Endocrine:  Negative for polydipsia, polyuria, symptoms of hypoglycemia (dizziness, hunger, sweating) Hematologic:  Negative for anemia, purpura, petechia, prolonged or excessive bleeding,  use of anticoagulants  Allergic:  Negative for difficulty breathing or choking as a result of exposure to anything; no shellfish allergy; no allergic response (rash/itch) to materials, foods  Physical exam: BP 125/84   Pulse 77   Ht 5\' 8"  (1.727 m)   Wt 223 lb (101.2 kg)   BMI 33.91 kg/m  GENERAL APPEARANCE:  Well appearing, well developed, well nourished, NAD HEENT: Atraumatic, Normocephalic, oropharynx clear. NECK: Supple without lymphadenopathy or thyromegaly. LUNGS: Clear to auscultation  bilaterally. HEART: Regular Rate and Rhythm without murmurs, gallops, or rubs. ABDOMEN: Soft, non-tender, No Masses. EXTREMITIES: Moves all extremities well.  Without clubbing, cyanosis, or edema. NEUROLOGIC:  Alert and oriented x 3, normal gait, CN II-XII grossly intact.  MENTAL STATUS:  Appropriate. BACK:  Non-tender to palpation.  No CVAT SKIN:  Warm, dry and intact.   GU: Penis:  circumcised Meatus: Normal Scrotum: normal, no masses Testis: normal without masses bilateral Epididymis: normal Prostate: 40 g, nontender, no nodules Rectum: Normal tone,  no masses or tenderness   Results: U/A: Dipstick negative  PVR: 61 ml

## 2022-07-11 NOTE — Progress Notes (Signed)
post void residual =15mL

## 2022-07-30 ENCOUNTER — Ambulatory Visit
Admission: RE | Admit: 2022-07-30 | Discharge: 2022-07-30 | Disposition: A | Discharge status: Home / Self Care | Payer: BC Managed Care – PPO | Source: Ambulatory Visit | Attending: Family Medicine | Admitting: Family Medicine

## 2022-07-30 DIAGNOSIS — R29898 Other symptoms and signs involving the musculoskeletal system: Secondary | ICD-10-CM

## 2022-07-30 MED ORDER — GADOPICLENOL 0.5 MMOL/ML IV SOLN
20.0000 mL | Freq: Once | INTRAVENOUS | Status: AC | PRN
  Administered 2022-07-30: 20 mL via INTRAVENOUS

## 2022-08-19 ENCOUNTER — Ambulatory Visit: Payer: Federal, State, Local not specified - PPO | Admitting: Urology

## 2022-08-19 ENCOUNTER — Encounter: Payer: Self-pay | Admitting: Urology

## 2022-08-19 VITALS — BP 134/80 | HR 80 | Ht 68.0 in | Wt 223.0 lb

## 2022-08-19 DIAGNOSIS — N401 Enlarged prostate with lower urinary tract symptoms: Secondary | ICD-10-CM

## 2022-08-19 DIAGNOSIS — N529 Male erectile dysfunction, unspecified: Secondary | ICD-10-CM

## 2022-08-19 DIAGNOSIS — R972 Elevated prostate specific antigen [PSA]: Secondary | ICD-10-CM | POA: Diagnosis not present

## 2022-08-19 DIAGNOSIS — N138 Other obstructive and reflux uropathy: Secondary | ICD-10-CM

## 2022-08-19 LAB — URINALYSIS, ROUTINE W REFLEX MICROSCOPIC
Bilirubin, UA: NEGATIVE
Glucose, UA: NEGATIVE
Ketones, UA: NEGATIVE
Leukocytes,UA: NEGATIVE
Nitrite, UA: NEGATIVE
Protein,UA: NEGATIVE
RBC, UA: NEGATIVE
Specific Gravity, UA: 1.015 (ref 1.005–1.030)
Urobilinogen, Ur: 0.2 mg/dL (ref 0.2–1.0)
pH, UA: 6 (ref 5.0–7.5)

## 2022-08-19 MED ORDER — TAMSULOSIN HCL 0.4 MG PO CAPS: 0.4000 mg | ORAL_CAPSULE | Freq: Two times a day (BID) | ORAL | 11 refills | Status: DC

## 2022-08-19 NOTE — Progress Notes (Signed)
Assessment: 1. BPH with obstruction/lower urinary tract symptoms   2. Rising PSA level   3. Organic impotence    Plan: Repeat PSA in late December 2023 - will call with results Continue tamsulosin to 0.4 mg twice daily.  New rx sent.  Chief Complaint:  Chief Complaint  Patient presents with   Benign Prostatic Hypertrophy    History of Present Illness:  Eric Johns is a 66 y.o. male who is seen for further evaluation of a rising PSA and lower urinary tract symptoms.  PSA results: 9/23 3.94 7/21 2.95 4/20 2.23 2/18 2.4  No history of UTIs or prostatitis.  No family history of prostate cancer.  No prior prostate biopsy.  He has a history of lower urinary tract symptoms including frequency, urgency, nocturia 1-2 times, postvoid dribbling, hesitancy, intermittent stream, and decreased force of stream.  His symptoms have been present for approximately 2 years.  No dysuria or gross hematuria.  He had been on tamsulosin 0.4 mg for approximately 1 year.  He reported some initial improvement in his urinary symptoms. IPSS = 19. PVR = 61 ml.  He has a history of erectile dysfunction.  He and his wife are not currently sexually active.  He has a prescription for sildenafil but has not used the medication recently.  No decrease in his libido.  His dose of tamsulosin was increased to 0.4 mg BID in October 2023.  He returns today for follow-up.  He continues to have symptoms of hesitancy, weak stream, intermittent stream and frequency.  He does feel like his symptoms have improved with the increased dose of tamsulosin.  No side effects.  No dysuria or gross hematuria. IPSS = 20 today.   Portions of the above documentation were copied from a prior visit for review purposes only.   Past Medical History:  Past Medical History:  Diagnosis Date   Gout    Hypogonadism in male    Mixed hyperlipidemia    NAFL (nonalcoholic fatty liver)    Obesity    OSA on CPAP 06/13/2014    Past  Surgical History:  Past Surgical History:  Procedure Laterality Date   APPENDECTOMY  1972    Allergies:  Allergies  Allergen Reactions   Cat Hair Extract Shortness Of Breath    SOB, watery eyes, itchy eyes   Other     Trees and hayes    Family History:  Family History  Problem Relation Age of Onset   Diverticulitis Father    Diabetes Maternal Grandfather    Emphysema Maternal Grandfather     Social History:  Social History   Tobacco Use   Smoking status: Never   Smokeless tobacco: Former    Types: Snuff    Quit date: 08/24/2007  Substance Use Topics   Alcohol use: Yes    Alcohol/week: 1.0 - 2.0 standard drink of alcohol    Types: 1 - 2 Glasses of wine per week   Drug use: No    ROS: Constitutional:  Negative for fever, chills, weight loss CV: Negative for chest pain, previous MI, hypertension Respiratory:  Negative for shortness of breath, wheezing, sleep apnea, frequent cough GI:  Negative for nausea, vomiting, bloody stool, GERD  Physical exam: BP 134/80   Pulse 80   Ht 5\' 8"  (1.727 m)   Wt 223 lb (101.2 kg)   BMI 33.91 kg/m  GENERAL APPEARANCE:  Well appearing, well developed, well nourished, NAD HEENT:  Atraumatic, normocephalic, oropharynx clear NECK:  Supple  without lymphadenopathy or thyromegaly ABDOMEN:  Soft, non-tender, no masses EXTREMITIES:  Moves all extremities well, without clubbing, cyanosis, or edema NEUROLOGIC:  Alert and oriented x 3, normal gait, CN II-XII grossly intact MENTAL STATUS:  appropriate BACK:  Non-tender to palpation, No CVAT SKIN:  Warm, dry, and intact   Results: U/A: dipstick negative

## 2022-08-21 ENCOUNTER — Ambulatory Visit: Payer: BC Managed Care – PPO | Admitting: Urology

## 2023-09-29 ENCOUNTER — Other Ambulatory Visit: Payer: Self-pay | Admitting: Urology

## 2023-09-29 DIAGNOSIS — N138 Other obstructive and reflux uropathy: Secondary | ICD-10-CM

## 2023-09-29 MED ORDER — TAMSULOSIN HCL 0.4 MG PO CAPS: 0.4000 mg | ORAL_CAPSULE | Freq: Two times a day (BID) | ORAL | 0 refills | Status: DC

## 2023-10-28 ENCOUNTER — Other Ambulatory Visit: Payer: Self-pay | Admitting: Urology

## 2023-10-28 DIAGNOSIS — N138 Other obstructive and reflux uropathy: Secondary | ICD-10-CM

## 2024-01-02 ENCOUNTER — Telehealth: Payer: Self-pay | Admitting: Urology

## 2024-01-02 ENCOUNTER — Other Ambulatory Visit: Payer: Self-pay | Admitting: Urology

## 2024-01-02 DIAGNOSIS — N401 Enlarged prostate with lower urinary tract symptoms: Secondary | ICD-10-CM

## 2024-01-02 MED ORDER — TAMSULOSIN HCL 0.4 MG PO CAPS: 0.4000 mg | ORAL_CAPSULE | Freq: Two times a day (BID) | ORAL | 3 refills | Status: DC

## 2024-01-02 NOTE — Telephone Encounter (Signed)
 Patient has appointment with you on April 23 however the patient's medication tamsulosin (FLOMAX) 0.4 MG CAPS capsule will run out on the April 16 th. Patient would like to know if you will send him enough for those eight days. Please advise.

## 2024-01-14 ENCOUNTER — Encounter: Payer: Self-pay | Admitting: Urology

## 2024-01-14 ENCOUNTER — Ambulatory Visit: Admitting: Urology

## 2024-01-14 VITALS — BP 124/78 | HR 81 | Ht 68.0 in | Wt 228.0 lb

## 2024-01-14 DIAGNOSIS — N529 Male erectile dysfunction, unspecified: Secondary | ICD-10-CM

## 2024-01-14 DIAGNOSIS — R972 Elevated prostate specific antigen [PSA]: Secondary | ICD-10-CM | POA: Diagnosis not present

## 2024-01-14 DIAGNOSIS — N138 Other obstructive and reflux uropathy: Secondary | ICD-10-CM | POA: Diagnosis not present

## 2024-01-14 DIAGNOSIS — N401 Enlarged prostate with lower urinary tract symptoms: Secondary | ICD-10-CM

## 2024-01-14 LAB — URINALYSIS, ROUTINE W REFLEX MICROSCOPIC
Bilirubin, UA: NEGATIVE
Glucose, UA: NEGATIVE
Ketones, UA: NEGATIVE
Leukocytes,UA: NEGATIVE
Nitrite, UA: NEGATIVE
Protein,UA: NEGATIVE
Specific Gravity, UA: 1.02 (ref 1.005–1.030)
Urobilinogen, Ur: 0.2 mg/dL (ref 0.2–1.0)
pH, UA: 5.5 (ref 5.0–7.5)

## 2024-01-14 LAB — MICROSCOPIC EXAMINATION

## 2024-01-14 NOTE — Progress Notes (Signed)
 Assessment: 1. BPH with obstruction/lower urinary tract symptoms   2. Elevated PSA   3. Organic impotence     Plan: Continue tamsulosin  to 0.4 mg twice daily.   I discussed a trial of a different alpha-blocker.  He would like to monitor his symptoms at this time. PSA pending - drawn by PCP Return to office in 6 months  Chief Complaint:  Chief Complaint  Patient presents with   Benign Prostatic Hypertrophy    History of Present Illness:  Eric Johns is a 68 y.o. male who is seen for further evaluation of a elevated PSA and lower urinary tract symptoms.  PSA results: 9/24 4.68 7/24 3.9 6/24 6.18 1/24 4.26 9/23 3.94 7/21 2.95 4/20 2.23 2/18 2.4  No history of UTIs or prostatitis.  No family history of prostate cancer.  No prior prostate biopsy.  He has a history of lower urinary tract symptoms including frequency, urgency, nocturia 1-2 times, postvoid dribbling, hesitancy, intermittent stream, and decreased force of stream.  His symptoms have been present for approximately 2 years.  No dysuria or gross hematuria.  He had been on tamsulosin  0.4 mg for approximately 1 year.  He reported some initial improvement in his urinary symptoms. IPSS = 19. PVR = 61 ml.  He has a history of erectile dysfunction.  He and his wife are not currently sexually active.  He has a prescription for sildenafil but has not used the medication recently.  No decrease in his libido.  His dose of tamsulosin  was increased to 0.4 mg BID in October 2023.  He returns today for follow-up.  He continues on tamsulosin  0.4 mg twice daily.  He continues to have intermittent lower urinary tract symptoms.  He reports daytime frequency voiding every 2 hours and associated urgency.  He has nocturia x 1.  He does have occasional leakage associated with urgency.  He reports a weak stream at times with intermittency.  No dysuria or gross hematuria. IPSS = 23/3.  Portions of the above documentation were copied  from a prior visit for review purposes only.   Past Medical History:  Past Medical History:  Diagnosis Date   Gout    Hypogonadism in male    Mixed hyperlipidemia    NAFL (nonalcoholic fatty liver)    Obesity    OSA on CPAP 06/13/2014    Past Surgical History:  Past Surgical History:  Procedure Laterality Date   APPENDECTOMY  1972    Allergies:  Allergies  Allergen Reactions   Cat Dander Shortness Of Breath    SOB, watery eyes, itchy eyes   Other     Trees and hayes    Family History:  Family History  Problem Relation Age of Onset   Diverticulitis Father    Diabetes Maternal Grandfather    Emphysema Maternal Grandfather     Social History:  Social History   Tobacco Use   Smoking status: Never   Smokeless tobacco: Former    Types: Snuff    Quit date: 08/24/2007  Substance Use Topics   Alcohol use: Yes    Alcohol/week: 1.0 - 2.0 standard drink of alcohol    Types: 1 - 2 Glasses of wine per week   Drug use: No    ROS: Constitutional:  Negative for fever, chills, weight loss CV: Negative for chest pain, previous MI, hypertension Respiratory:  Negative for shortness of breath, wheezing, sleep apnea, frequent cough GI:  Negative for nausea, vomiting, bloody stool, GERD  Physical  exam: BP 124/78   Pulse 81   Ht 5\' 8"  (1.727 m)   Wt 228 lb (103.4 kg)   BMI 34.67 kg/m  GENERAL APPEARANCE:  Well appearing, well developed, well nourished, NAD HEENT:  Atraumatic, normocephalic, oropharynx clear NECK:  Supple without lymphadenopathy or thyromegaly ABDOMEN:  Soft, non-tender, no masses EXTREMITIES:  Moves all extremities well, without clubbing, cyanosis, or edema NEUROLOGIC:  Alert and oriented x 3, normal gait, CN II-XII grossly intact MENTAL STATUS:  appropriate BACK:  Non-tender to palpation, No CVAT SKIN:  Warm, dry, and intact GU: Prostate: 40 g, NT, no nodules Rectum: Normal tone,  no masses or tenderness   Results: U/A: 0-5 WBCs, 0-2 RBCs

## 2024-01-17 ENCOUNTER — Encounter: Payer: Self-pay | Admitting: Urology

## 2024-03-09 ENCOUNTER — Ambulatory Visit: Admitting: Pulmonary Disease

## 2024-04-01 ENCOUNTER — Other Ambulatory Visit: Payer: Self-pay | Admitting: Urology

## 2024-04-01 ENCOUNTER — Ambulatory Visit: Admitting: Pulmonary Disease

## 2024-04-01 ENCOUNTER — Other Ambulatory Visit
Admission: RE | Admit: 2024-04-01 | Discharge: 2024-04-01 | Disposition: A | Discharge status: Home / Self Care | Source: Ambulatory Visit | Attending: Pulmonary Disease | Admitting: Pulmonary Disease

## 2024-04-01 ENCOUNTER — Encounter: Payer: Self-pay | Admitting: Pulmonary Disease

## 2024-04-01 VITALS — BP 120/68 | HR 78 | Temp 96.9°F | Ht 68.0 in | Wt 234.2 lb

## 2024-04-01 DIAGNOSIS — R0602 Shortness of breath: Secondary | ICD-10-CM | POA: Insufficient documentation

## 2024-04-01 DIAGNOSIS — N138 Other obstructive and reflux uropathy: Secondary | ICD-10-CM

## 2024-04-01 LAB — CBC WITH DIFFERENTIAL/PLATELET
Abs Immature Granulocytes: 0.03 K/uL (ref 0.00–0.07)
Basophils Absolute: 0 K/uL (ref 0.0–0.1)
Basophils Relative: 1 %
Eosinophils Absolute: 0.4 K/uL (ref 0.0–0.5)
Eosinophils Relative: 5 %
HCT: 46.7 % (ref 39.0–52.0)
Hemoglobin: 15.8 g/dL (ref 13.0–17.0)
Immature Granulocytes: 0 %
Lymphocytes Relative: 24 %
Lymphs Abs: 2 K/uL (ref 0.7–4.0)
MCH: 31.7 pg (ref 26.0–34.0)
MCHC: 33.8 g/dL (ref 30.0–36.0)
MCV: 93.8 fL (ref 80.0–100.0)
Monocytes Absolute: 0.7 K/uL (ref 0.1–1.0)
Monocytes Relative: 8 %
Neutro Abs: 5.2 K/uL (ref 1.7–7.7)
Neutrophils Relative %: 62 %
Platelets: 233 K/uL (ref 150–400)
RBC: 4.98 MIL/uL (ref 4.22–5.81)
RDW: 13.4 % (ref 11.5–15.5)
WBC: 8.3 K/uL (ref 4.0–10.5)
nRBC: 0 % (ref 0.0–0.2)

## 2024-04-01 LAB — NITRIC OXIDE: Nitric Oxide: 23

## 2024-04-01 MED ORDER — BREZTRI AEROSPHERE 160-9-4.8 MCG/ACT IN AERO: 2.0000 | INHALATION_SPRAY | Freq: Two times a day (BID) | RESPIRATORY_TRACT | 0 refills | Status: AC

## 2024-04-01 MED ORDER — BREZTRI AEROSPHERE 160-9-4.8 MCG/ACT IN AERO: 2.0000 | INHALATION_SPRAY | Freq: Two times a day (BID) | RESPIRATORY_TRACT | 3 refills | Status: AC

## 2024-04-01 NOTE — Progress Notes (Signed)
 Synopsis: Referred in by Claudene Rayfield HERO, MD   Subjective:   PATIENT ID: Eric Johns GENDER: male DOB: [birth year], MRN: [id number]  Chief Complaint  Patient presents with   Consult    Diagnosed with Asthma. SOB. Positional SOB. Little wheezing. Little cough with green sputum.     HPI Eric Johns is a 49 male patient with a past medical history of moderate persistent asthma, allergic rhinitis, obesity and OSA on CPAP presenting today to the pulmonary clinic to establish care.  He underwent total right knee replacement in January 2025 and with delayed implant his oxygen saturation went down to as low as 82%.  After going to recover.  Therefore a referral was placed to see pulmonary.  He reports that he was diagnosed with asthma few years ago was started on Symbicort .  Symptoms usually include upon exertion mild wheezing and some dry cough.  He denies any hospitalization for asthma.  He denies any hypersensitivity to strong scents cold air or heat and humidity.  Fort the past few months, he has been having worsening shortness of breath on exertion with more congestion.   PFTs in 2022 with normal FEV1, normal FVC and increased FEV1 to FVC ratio.  Lung volumes are normal.  Normal diffusing capacity.  There is no significant response to bronchodilator.  Labs:   EOS 2019 800 Total IgE 143.  Cat dander IgE 12.2.    Family history: Father with asthma  Social history: Has 5 cats at home.  Lives at home with his wife.  He is an International aid/development worker.  Never smoker.  ROS All systems were reviewed and are negative except for the above Objective:   Vitals:   04/01/24 0844  BP: 120/68  Pulse: 78  Temp: (!) 96.9 F (36.1 C)  SpO2: 93%  Weight: 234 lb 3.2 oz (106.2 kg)  Height: 5' 8 (1.727 m)   93% on RA BMI Readings from Last 3 Encounters:  04/01/24 35.61 kg/m  01/14/24 34.67 kg/m  08/19/22 33.91 kg/m   Wt Readings from Last 3 Encounters:  04/01/24 234 lb 3.2  oz (106.2 kg)  01/14/24 228 lb (103.4 kg)  08/19/22 223 lb (101.2 kg)    Physical Exam GEN: NAD, obese. HEENT: Supple Neck, Reactive Pupils, EOMI  CVS: Normal S1, Normal S2, RRR, No murmurs or ES appreciated  Lungs: Bibasilar dry crackles noted.  Abdomen: Soft, non tender, non distended, + BS  Extremities: Warm and well perfused, No edema   Ancillary Information   CBC    Component Value Date/Time   WBC 7.1 11/21/2020 1036   RBC 4.90 11/21/2020 1036   HGB 16.2 11/21/2020 1036   HGB 18.7 (H) 03/06/2018 1030   HCT 46.5 11/21/2020 1036   HCT 53.8 (H) 03/06/2018 1030   PLT 221 11/21/2020 1036   PLT 235 03/06/2018 1030   MCV 94.9 11/21/2020 1036   MCV 94 03/06/2018 1030   MCH 33.1 11/21/2020 1036   MCHC 34.8 11/21/2020 1036   RDW 13.3 11/21/2020 1036   RDW 14.6 03/06/2018 1030   LYMPHSABS 2.1 11/21/2020 1036   LYMPHSABS 2.4 03/06/2018 1030   MONOABS 0.5 11/21/2020 1036   EOSABS 0.8 (H) 11/21/2020 1036   EOSABS 0.4 03/06/2018 1030   BASOSABS 0.1 11/21/2020 1036   BASOSABS 0.0 03/06/2018 1030      Latest Ref Rng & Units 11/30/2020   11:18 AM  PFT Results  FVC-Predicted Pre % 91  P  FVC-Post L 3.73  P  FVC-Predicted Post % 87  P  Pre FEV1/FVC % % 84  P  Post FEV1/FCV % % 88  P  FEV1-Pre L 3.27  P  FEV1-Predicted Pre % 102  P  FEV1-Post L 3.27  P  DLCO uncorrected ml/min/mmHg 29.85  P  DLCO UNC% % 118  P  DLVA Predicted % 115  P  TLC L 6.31  P  TLC % Predicted % 95  P  RV % Predicted % 110  P    P Preliminary result     Assessment & Plan:  Eric Johns is a 52 male patient with a past medical history of moderate persistent asthma, allergic rhinitis, obesity and OSA on CPAP presenting today to the pulmonary clinic to establish care.  #Moderate persistent asthma  FENO 23 indeterminate for intrapulmonary eosinophilic inflammation.   []  Step up therapy to budesonide -formoterol -glycopyrrolate [Breztri ] 160-4.5 2 puffs BID.  []  C/w Albuterol  as needed.  []  Obtain  PFTs  []  Obtain Echocardiogram to assess cardiac function and PASP w/ worsening dyspnea.   #Bibasilar dry crackles on exam.  Suspicious for ILD. Will obtain CT Chest based on the above results.   Return in about 3 months (around 07/02/2024).  I spent 60 minutes caring for this patient today, including preparing to see the patient, obtaining a medical history , reviewing a separately obtained history, performing a medically appropriate examination and/or evaluation, counseling and educating the patient/family/caregiver, ordering medications, tests, or procedures, documenting clinical information in the electronic health record, and independently interpreting results (not separately reported/billed) and communicating results to the patient/family/caregiver  Darrin Barn, MD [facility] 04/01/2024 9:30 AM

## 2024-04-06 ENCOUNTER — Telehealth: Payer: Self-pay | Admitting: Urology

## 2024-04-06 ENCOUNTER — Other Ambulatory Visit: Payer: Self-pay | Admitting: Urology

## 2024-04-06 DIAGNOSIS — N401 Enlarged prostate with lower urinary tract symptoms: Secondary | ICD-10-CM

## 2024-04-06 MED ORDER — TAMSULOSIN HCL 0.4 MG PO CAPS: 0.4000 mg | ORAL_CAPSULE | Freq: Two times a day (BID) | ORAL | 11 refills | Status: AC

## 2024-04-06 NOTE — Telephone Encounter (Signed)
 Pt called about his medication tamsulosin  (FLOMAX ) 0.4 MG CAPS capsule [[id number]] he stated he has three more pills left and the pharmacy told him to contact provider. Please advise.

## 2024-04-17 ENCOUNTER — Ambulatory Visit: Payer: Self-pay | Admitting: Pulmonary Disease

## 2024-06-07 ENCOUNTER — Ambulatory Visit
Admission: RE | Admit: 2024-06-07 | Discharge: 2024-06-07 | Disposition: A | Discharge status: Home / Self Care | Source: Ambulatory Visit | Attending: Pulmonary Disease

## 2024-06-07 DIAGNOSIS — I358 Other nonrheumatic aortic valve disorders: Secondary | ICD-10-CM

## 2024-06-07 DIAGNOSIS — I517 Cardiomegaly: Secondary | ICD-10-CM

## 2024-06-07 DIAGNOSIS — R0602 Shortness of breath: Secondary | ICD-10-CM

## 2024-06-07 DIAGNOSIS — R0600 Dyspnea, unspecified: Secondary | ICD-10-CM | POA: Diagnosis not present

## 2024-06-07 DIAGNOSIS — I5030 Unspecified diastolic (congestive) heart failure: Secondary | ICD-10-CM | POA: Diagnosis not present

## 2024-06-07 LAB — ECHOCARDIOGRAM COMPLETE
AR max vel: 2.83 cm2
AV Area VTI: 2.49 cm2
AV Area mean vel: 2.64 cm2
AV Mean grad: 5 mmHg
AV Peak grad: 8 mmHg
Ao pk vel: 1.41 m/s
Area-P 1/2: 3.77 cm2
MV VTI: 2.38 cm2
S' Lateral: 2.9 cm

## 2024-07-15 ENCOUNTER — Encounter: Payer: Self-pay | Admitting: Urology

## 2024-07-15 ENCOUNTER — Ambulatory Visit: Admitting: Urology

## 2024-07-15 VITALS — BP 133/78 | HR 72 | Ht 68.0 in | Wt 232.0 lb

## 2024-07-15 DIAGNOSIS — N138 Other obstructive and reflux uropathy: Secondary | ICD-10-CM

## 2024-07-15 DIAGNOSIS — N529 Male erectile dysfunction, unspecified: Secondary | ICD-10-CM | POA: Diagnosis not present

## 2024-07-15 DIAGNOSIS — R972 Elevated prostate specific antigen [PSA]: Secondary | ICD-10-CM

## 2024-07-15 DIAGNOSIS — N401 Enlarged prostate with lower urinary tract symptoms: Secondary | ICD-10-CM

## 2024-07-15 LAB — URINALYSIS, ROUTINE W REFLEX MICROSCOPIC
Bilirubin, UA: NEGATIVE
Glucose, UA: NEGATIVE
Ketones, UA: NEGATIVE
Leukocytes,UA: NEGATIVE
Nitrite, UA: NEGATIVE
Protein,UA: NEGATIVE
RBC, UA: NEGATIVE
Specific Gravity, UA: 1.02 (ref 1.005–1.030)
Urobilinogen, Ur: 0.2 mg/dL (ref 0.2–1.0)
pH, UA: 6 (ref 5.0–7.5)

## 2024-07-15 NOTE — Progress Notes (Signed)
 Assessment: 1. BPH with obstruction/lower urinary tract symptoms   2. Elevated PSA   3. Organic impotence     Plan: Continue tamsulosin  to 0.4 mg twice daily.   I discussed a trial of a different alpha-blocker.  He would like to continue with tamsulosin  at this time. Free and total PSA today Return to office in 6 months  Chief Complaint:  Chief Complaint  Patient presents with   Benign Prostatic Hypertrophy    History of Present Illness:  Eric Johns is a 68 y.o. male who is seen for further evaluation of a elevated PSA and lower urinary tract symptoms.  PSA results: 9/24 4.68 7/24 3.9 6/24 6.18 1/24 4.26 9/23 3.94 7/21 2.95 4/20 2.23 2/18 2.4  No history of UTIs or prostatitis.  No family history of prostate cancer.  No prior prostate biopsy.  He has a history of lower urinary tract symptoms including frequency, urgency, nocturia 1-2 times, postvoid dribbling, hesitancy, intermittent stream, and decreased force of stream.  His symptoms have been present for approximately 2 years.  No dysuria or gross hematuria.  He had been on tamsulosin  0.4 mg for approximately 1 year.  He reported some initial improvement in his urinary symptoms. IPSS = 19. PVR = 61 ml.  He has a history of erectile dysfunction.  He and his wife are not currently sexually active.  He has a prescription for sildenafil but has not used the medication recently.  No decrease in his libido.  His dose of tamsulosin  was increased to 0.4 mg BID in October 2023.  At his visit in April 2025, he continued on tamsulosin  0.4 mg twice daily.  He continued to have intermittent lower urinary tract symptoms.  He reported daytime frequency voiding every 2 hours and associated urgency and nocturia x 1.  He had occasional leakage associated with urgency.  He reported a weak stream at times with intermittency.  No dysuria or gross hematuria. IPSS = 23/3.  He returns today for follow-up.  He continues on tamsulosin   0.4 mg twice daily.  He reports symptoms of a weakened stream, intermittent stream, sensation of incomplete emptying, urgency.  No dysuria or gross hematuria. IPSS = 15/3. He also reports erectile dysfunction.  He has a prescription for sildenafil but has not used this.  He reports that he is not currently sexually active due to his wife's health issues.  Portions of the above documentation were copied from a prior visit for review purposes only.   Past Medical History:  Past Medical History:  Diagnosis Date   Gout    Hypogonadism in male    Mixed hyperlipidemia    NAFL (nonalcoholic fatty liver)    Obesity    OSA on CPAP 06/13/2014    Past Surgical History:  Past Surgical History:  Procedure Laterality Date   APPENDECTOMY  1972    Allergies:  Allergies  Allergen Reactions   Cat Dander Shortness Of Breath    SOB, watery eyes, itchy eyes   Other     Trees and hayes    Family History:  Family History  Problem Relation Age of Onset   Diverticulitis Father    Diabetes Maternal Grandfather    Emphysema Maternal Grandfather     Social History:  Social History   Tobacco Use   Smoking status: Never   Smokeless tobacco: Former    Types: Snuff    Quit date: 08/24/2007  Substance Use Topics   Alcohol use: Yes  Alcohol/week: 1.0 - 2.0 standard drink of alcohol    Types: 1 - 2 Glasses of wine per week   Drug use: No    ROS: Constitutional:  Negative for fever, chills, weight loss CV: Negative for chest pain, previous MI, hypertension Respiratory:  Negative for shortness of breath, wheezing, sleep apnea, frequent cough GI:  Negative for nausea, vomiting, bloody stool, GERD  Physical exam: BP 133/78   Pulse 72   Ht 5' 8 (1.727 m)   Wt 232 lb (105.2 kg)   BMI 35.28 kg/m  GENERAL APPEARANCE:  Well appearing, well developed, well nourished, NAD HEENT:  Atraumatic, normocephalic, oropharynx clear NECK:  Supple without lymphadenopathy or thyromegaly ABDOMEN:   Soft, non-tender, no masses EXTREMITIES:  Moves all extremities well, without clubbing, cyanosis, or edema NEUROLOGIC:  Alert and oriented x 3, normal gait, CN II-XII grossly intact MENTAL STATUS:  appropriate BACK:  Non-tender to palpation, No CVAT SKIN:  Warm, dry, and intact   Results: U/A: Negative

## 2024-07-16 ENCOUNTER — Ambulatory Visit: Payer: Self-pay | Admitting: Urology

## 2024-07-16 LAB — PSA, TOTAL AND FREE
PSA, Free Pct: 21.6 %
PSA, Free: 0.8 ng/mL
Prostate Specific Ag, Serum: 3.7 ng/mL (ref 0.0–4.0)

## 2024-07-22 ENCOUNTER — Ambulatory Visit: Admitting: Pulmonary Disease

## 2024-07-22 ENCOUNTER — Ambulatory Visit

## 2024-07-22 DIAGNOSIS — R0602 Shortness of breath: Secondary | ICD-10-CM | POA: Diagnosis not present

## 2024-07-22 LAB — PULMONARY FUNCTION TEST
DL/VA % pred: 125 %
DL/VA: 5.08 ml/min/mmHg/L
DLCO unc % pred: 118 %
DLCO unc: 28.87 ml/min/mmHg
FEF 25-75 Post: 3.4 L/s
FEF 25-75 Pre: 3.44 L/s
FEF2575-%Change-Post: -1 %
FEF2575-%Pred-Post: 145 %
FEF2575-%Pred-Pre: 146 %
FEV1-%Change-Post: -1 %
FEV1-%Pred-Post: 100 %
FEV1-%Pred-Pre: 101 %
FEV1-Post: 3.05 L
FEV1-Pre: 3.08 L
FEV1FVC-%Change-Post: -5 %
FEV1FVC-%Pred-Pre: 113 %
FEV6-%Change-Post: 4 %
FEV6-%Pred-Post: 98 %
FEV6-%Pred-Pre: 94 %
FEV6-Post: 3.84 L
FEV6-Pre: 3.67 L
FEV6FVC-%Change-Post: 0 %
FEV6FVC-%Pred-Post: 105 %
FEV6FVC-%Pred-Pre: 106 %
FVC-%Change-Post: 5 %
FVC-%Pred-Post: 93 %
FVC-%Pred-Pre: 88 %
FVC-Post: 3.85 L
FVC-Pre: 3.67 L
Post FEV1/FVC ratio: 79 %
Post FEV6/FVC ratio: 100 %
Pre FEV1/FVC ratio: 84 %
Pre FEV6/FVC Ratio: 100 %
RV % pred: 105 %
RV: 2.45 L
TLC % pred: 93 %
TLC: 6.19 L

## 2024-07-22 NOTE — Progress Notes (Signed)
 Full PFT completed today ? ?

## 2024-07-22 NOTE — Patient Instructions (Signed)
 Full PFT completed today ? ?

## 2024-07-28 ENCOUNTER — Ambulatory Visit: Admitting: Pulmonary Disease

## 2024-07-28 ENCOUNTER — Encounter: Payer: Self-pay | Admitting: Pulmonary Disease

## 2024-07-28 VITALS — BP 122/72 | HR 84 | Temp 98.1°F | Ht 68.0 in | Wt 237.0 lb

## 2024-07-28 DIAGNOSIS — J4540 Moderate persistent asthma, uncomplicated: Secondary | ICD-10-CM | POA: Diagnosis not present

## 2024-07-28 NOTE — Progress Notes (Signed)
 Synopsis: Referred in by Malka Domino, MD   Subjective:   PATIENT ID: Eric Johns GENDER: male DOB: 20-Jun-1956, MRN: [id number]  Chief Complaint  Patient presents with   Shortness of Breath    DOE. Dry cough. No wheezing.  Breztri - BID helps with his breathing. Albuterol - PRN    HPI Eric Johns is a 77 male patient with a past medical history of moderate persistent asthma, allergic rhinitis, obesity and OSA on CPAP presenting today to the pulmonary clinic to establish care.  He underwent total right knee replacement in January 2025 and with delayed implant his oxygen saturation went down to as low as 82%.  After going to recover.  Therefore a referral was placed to see pulmonary.  He reports that he was diagnosed with asthma few years ago was started on Symbicort .  Symptoms usually include upon exertion mild wheezing and some dry cough.  He denies any hospitalization for asthma.  He denies any hypersensitivity to strong scents cold air or heat and humidity.  Fort the past few months, he has been having worsening shortness of breath on exertion with more congestion.   PFTs in 2022 with normal FEV1, normal FVC and increased FEV1 to FVC ratio.  Lung volumes are normal.  Normal diffusing capacity.  There is no significant response to bronchodilator.  Labs:   EOS 2019 800 Total IgE 143.  Cat dander IgE 12.2.    Family history: Father with asthma  Social history: Has 5 cats at home.  Lives at home with his wife.  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  OV 07/28/2024 [place] Eric is here to follow up re his PFTs results. He is doing overall well. Feels like Breztri  is helping. Felt good after doing the post bronchodilator spirometry. PFTs wihtout any restriction. No obstruction. Some response to bronchodilators. Echocardiogram with nl EF and normal RV. Grad I diastolic dysfunction. ACT in office 17. We discussed step up therapy to biologics but decided to give  the inhaler more time and we will assess need in 3 months.   ROS All systems were reviewed and are negative except for the above Objective:   Vitals:   07/28/24 0953  BP: 122/72  Pulse: 84  Temp: 98.1 F (36.7 C)  SpO2: 98%  Weight: 237 lb (107.5 kg)  Height: 5' 8 (1.727 m)   98% on RA BMI Readings from Last 3 Encounters:  07/28/24 36.04 kg/m  07/22/24 35.31 kg/m  07/15/24 35.28 kg/m   Wt Readings from Last 3 Encounters:  07/28/24 237 lb (107.5 kg)  07/22/24 232 lb 3.2 oz (105.3 kg)  07/15/24 232 lb (105.2 kg)    Physical Exam GEN: NAD, obese. HEENT: Supple Neck, Reactive Pupils, EOMI  CVS: Normal S1, Normal S2, RRR, No murmurs or ES appreciated  Lungs: Bibasilar dry crackles noted.  Abdomen: Soft, non tender, non distended, + BS  Extremities: Warm and well perfused, No edema   Ancillary Information   CBC    Component Value Date/Time   WBC 8.3 04/01/2024 0955   RBC 4.98 04/01/2024 0955   HGB 15.8 04/01/2024 0955   HGB 18.7 (H) 03/06/2018 1030   HCT 46.7 04/01/2024 0955   HCT 53.8 (H) 03/06/2018 1030   PLT 233 04/01/2024 0955   PLT 235 03/06/2018 1030   MCV 93.8 04/01/2024 0955   MCV 94 03/06/2018 1030   MCH 31.7 04/01/2024 0955   MCHC 33.8 04/01/2024 0955   RDW 13.4 04/01/2024 0955  RDW 14.6 03/06/2018 1030   LYMPHSABS 2.0 04/01/2024 0955   LYMPHSABS 2.4 03/06/2018 1030   MONOABS 0.7 04/01/2024 0955   EOSABS 0.4 04/01/2024 0955   EOSABS 0.4 03/06/2018 1030   BASOSABS 0.0 04/01/2024 0955   BASOSABS 0.0 03/06/2018 1030      Latest Ref Rng & Units 07/22/2024    1:11 PM 11/30/2020   11:18 AM  PFT Results  FVC-Pre L 3.67  P   FVC-Predicted Pre % 88  P 91  P  FVC-Post L 3.85  P 3.73  P  FVC-Predicted Post % 93  P 87  P  Pre FEV1/FVC % % 84  P 84  P  Post FEV1/FCV % % 79  P 88  P  FEV1-Pre L 3.08  P 3.27  P  FEV1-Predicted Pre % 101  P 102  P  FEV1-Post L 3.05  P 3.27  P  DLCO uncorrected ml/min/mmHg 28.87  P 29.85  P  DLCO UNC% % 118  P 118   P  DLVA Predicted % 125  P 115  P  TLC L 6.19  P 6.31  P  TLC % Predicted % 93  P 95  P  RV % Predicted % 105  P 110  P    P Preliminary result     Assessment & Plan:  Eric Johns is a 35 male patient with a past medical history of moderate persistent asthma, allergic rhinitis, obesity and OSA on CPAP presenting today to the pulmonary clinic to establish care.  #Moderate persistent asthma  FENO 23 indeterminate for intrapulmonary eosinophilic inflammation.  EOS 2019 800 Total IgE 143.  Cat dander IgE 12.2.  []  c/w budesonide -formoterol -glycopyrrolate [Breztri ] 160-4.5 2 puffs BID.  []  C/w Albuterol  as needed.   #Bibasilar dry crackles on exam.  Suspicious for ILD. No restriction on PFTs. Will discuss obtaining a CT chest wo contrast during the next visit.    RTC 3 months.   I personally spent a total of 30 minutes in the care of the patient today including preparing to see the patient, getting/reviewing separately obtained history, performing a medically appropriate exam/evaluation, counseling and educating, placing orders, documenting clinical information in the EHR, independently interpreting results, and communicating results.   Darrin Barn, MD [facility] 07/28/2024 10:26 AM

## 2024-11-05 ENCOUNTER — Ambulatory Visit: Admitting: Pulmonary Disease

## 2025-01-13 ENCOUNTER — Ambulatory Visit: Admitting: Urology
